# Patient Record
Sex: Female | Born: 1980 | Race: White | Hispanic: No | Marital: Married | Smoking: Former smoker
Health system: Southern US, Community
[De-identification: ages and names within clinical notes are randomized; demographics above are authoritative.]

## PROBLEM LIST (undated history)

## (undated) ENCOUNTER — Inpatient Hospital Stay (HOSPITAL_COMMUNITY): Payer: Self-pay

## (undated) DIAGNOSIS — K439 Ventral hernia without obstruction or gangrene: Secondary | ICD-10-CM

## (undated) DIAGNOSIS — D649 Anemia, unspecified: Secondary | ICD-10-CM

## (undated) DIAGNOSIS — R2240 Localized swelling, mass and lump, unspecified lower limb: Secondary | ICD-10-CM

## (undated) DIAGNOSIS — F909 Attention-deficit hyperactivity disorder, unspecified type: Secondary | ICD-10-CM

## (undated) DIAGNOSIS — E079 Disorder of thyroid, unspecified: Secondary | ICD-10-CM

## (undated) DIAGNOSIS — E039 Hypothyroidism, unspecified: Secondary | ICD-10-CM

## (undated) DIAGNOSIS — Z9889 Other specified postprocedural states: Secondary | ICD-10-CM

## (undated) DIAGNOSIS — R52 Pain, unspecified: Secondary | ICD-10-CM

## (undated) DIAGNOSIS — M778 Other enthesopathies, not elsewhere classified: Secondary | ICD-10-CM

## (undated) DIAGNOSIS — R51 Headache: Secondary | ICD-10-CM

## (undated) DIAGNOSIS — Z9884 Bariatric surgery status: Secondary | ICD-10-CM

## (undated) DIAGNOSIS — F410 Panic disorder [episodic paroxysmal anxiety] without agoraphobia: Secondary | ICD-10-CM

## (undated) DIAGNOSIS — M791 Myalgia, unspecified site: Secondary | ICD-10-CM

## (undated) DIAGNOSIS — G8929 Other chronic pain: Secondary | ICD-10-CM

## (undated) DIAGNOSIS — H029 Unspecified disorder of eyelid: Secondary | ICD-10-CM

## (undated) DIAGNOSIS — L309 Dermatitis, unspecified: Secondary | ICD-10-CM

## (undated) DIAGNOSIS — F32A Depression, unspecified: Secondary | ICD-10-CM

## (undated) DIAGNOSIS — L0390 Cellulitis, unspecified: Secondary | ICD-10-CM

## (undated) DIAGNOSIS — F411 Generalized anxiety disorder: Secondary | ICD-10-CM

## (undated) HISTORY — DX: Localized swelling, mass and lump, unspecified lower limb: R22.40

## (undated) HISTORY — DX: Pain, unspecified: R52

## (undated) HISTORY — DX: Myalgia, unspecified site: M79.10

## (undated) HISTORY — DX: Dermatitis, unspecified: L30.9

## (undated) HISTORY — PX: INCISIONAL HERNIA REPAIR: SHX193

## (undated) HISTORY — DX: Depression, unspecified: F32.A

## (undated) HISTORY — DX: Ventral hernia without obstruction or gangrene: K43.9

## (undated) HISTORY — PX: PANNICULECTOMY: SUR1001

## (undated) HISTORY — DX: Bariatric surgery status: Z98.84

## (undated) HISTORY — DX: Cellulitis, unspecified: L03.90

## (undated) HISTORY — DX: Hypothyroidism, unspecified: E03.9

## (undated) HISTORY — DX: Other chronic pain: G89.29

## (undated) HISTORY — DX: Other enthesopathies, not elsewhere classified: M77.8

## (undated) HISTORY — PX: REVISION OF ABDOMINAL SCAR: SHX2347

## (undated) HISTORY — DX: Generalized anxiety disorder: F41.1

## (undated) HISTORY — DX: Other specified postprocedural states: Z98.890

## (undated) HISTORY — PX: INCISION AND DRAINAGE: SHX5863

## (undated) HISTORY — DX: Unspecified disorder of eyelid: H02.9

## (undated) HISTORY — PX: HERNIA REPAIR: SHX51

## (undated) HISTORY — PX: BREAST REDUCTION SURGERY: SHX8

## (undated) HISTORY — PX: TUBAL LIGATION: SHX77

## (undated) HISTORY — DX: Panic disorder (episodic paroxysmal anxiety): F41.0

## (undated) HISTORY — PX: SLEEVE GASTROPLASTY: SHX1101

## (undated) HISTORY — PX: WISDOM TOOTH EXTRACTION: SHX21

## (undated) HISTORY — DX: Anemia, unspecified: D64.9

## (undated) HISTORY — DX: Attention-deficit hyperactivity disorder, unspecified type: F90.9

## (undated) HISTORY — PX: SALPINGECTOMY: SHX328

## (undated) HISTORY — PX: DILATION AND CURETTAGE OF UTERUS: SHX78

## (undated) HISTORY — DX: Disorder of thyroid, unspecified: E07.9

## (undated) HISTORY — DX: Headache: R51

---

## 2005-08-10 ENCOUNTER — Emergency Department (HOSPITAL_COMMUNITY): Admission: EM | Admit: 2005-08-10 | Discharge: 2005-08-10 | Payer: Self-pay | Admitting: Family Medicine

## 2007-11-07 ENCOUNTER — Emergency Department (HOSPITAL_COMMUNITY): Admission: EM | Admit: 2007-11-07 | Discharge: 2007-11-07 | Payer: Self-pay | Admitting: Emergency Medicine

## 2007-12-13 ENCOUNTER — Inpatient Hospital Stay (HOSPITAL_COMMUNITY): Admission: AD | Admit: 2007-12-13 | Discharge: 2007-12-13 | Payer: Self-pay | Admitting: Family Medicine

## 2007-12-23 ENCOUNTER — Inpatient Hospital Stay (HOSPITAL_COMMUNITY): Admission: RE | Admit: 2007-12-23 | Discharge: 2007-12-23 | Payer: Self-pay | Admitting: Family Medicine

## 2008-02-11 DIAGNOSIS — D649 Anemia, unspecified: Secondary | ICD-10-CM

## 2008-02-11 HISTORY — DX: Anemia, unspecified: D64.9

## 2008-03-03 ENCOUNTER — Inpatient Hospital Stay (HOSPITAL_COMMUNITY): Admission: AD | Admit: 2008-03-03 | Discharge: 2008-03-03 | Payer: Self-pay | Admitting: Obstetrics & Gynecology

## 2008-07-29 ENCOUNTER — Inpatient Hospital Stay (HOSPITAL_COMMUNITY): Admission: AD | Admit: 2008-07-29 | Discharge: 2008-07-29 | Payer: Self-pay | Admitting: Obstetrics and Gynecology

## 2008-08-18 ENCOUNTER — Inpatient Hospital Stay (HOSPITAL_COMMUNITY): Admission: AD | Admit: 2008-08-18 | Discharge: 2008-08-22 | Payer: Self-pay | Admitting: Obstetrics and Gynecology

## 2008-11-05 IMAGING — US US OB COMP LESS 14 WK
1 series · 14 of 21 positions shown · non-contrast
Comparison: None

CLINICAL DATA: First trimester pregnancy.  Pelvic cramping.

OBSTETRIC <14 WK US AND TRANSVAGINAL OB US
TECHNIQUE: Both transabdominal and transvaginal ultrasound
examinations were performed for complete evaluation of the
gestation as well as the maternal uterus, adnexal regions, and
pelvic cul-de-sac.

[Series 1: us ob comp less 14 wk · 0.33mm/px · 21 acquisitions, 14 frames shown]
[im 1/21]
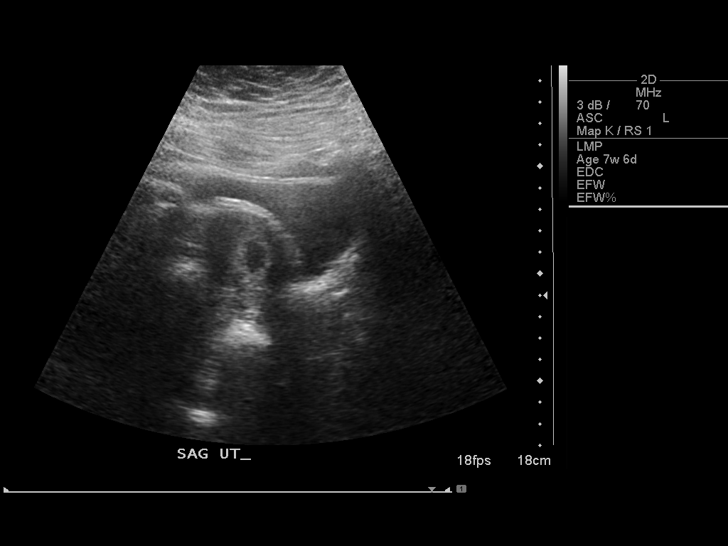
[im 3/21]
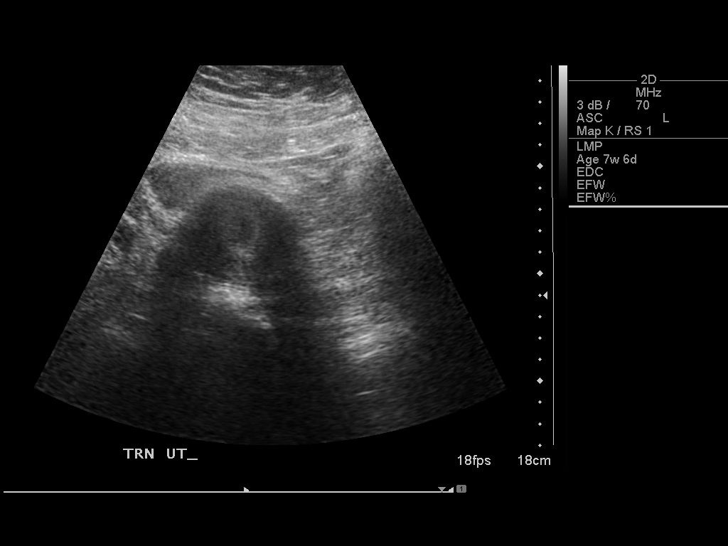
[im 4/21]
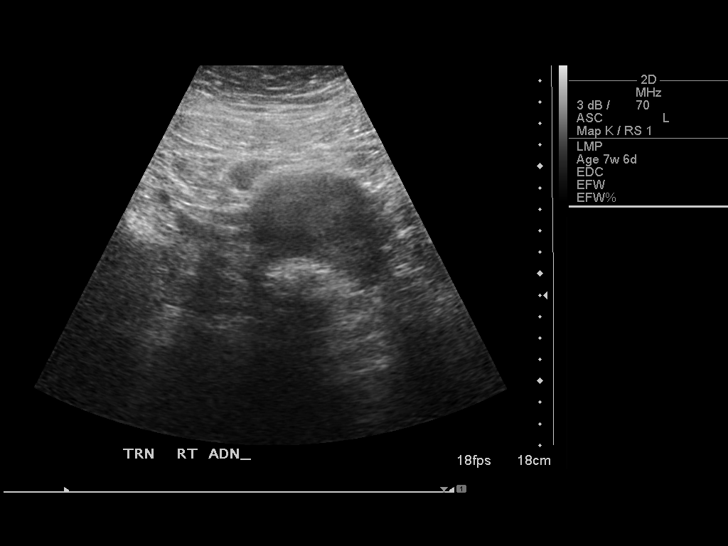
[im 6/21]
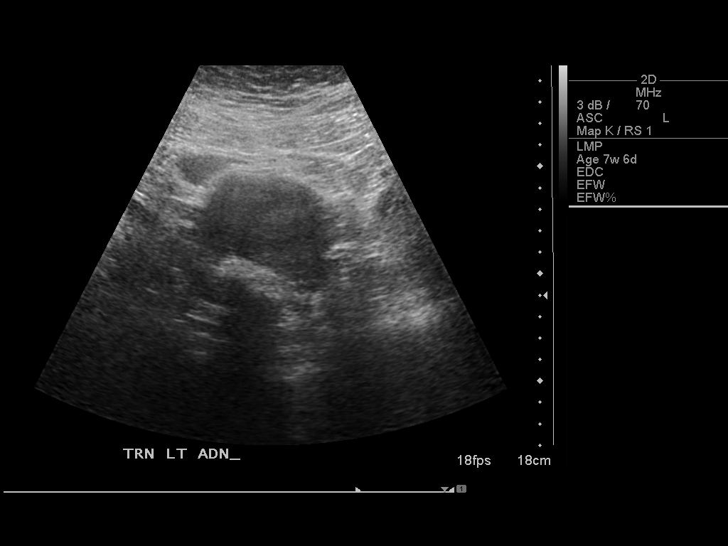
[im 7/21]
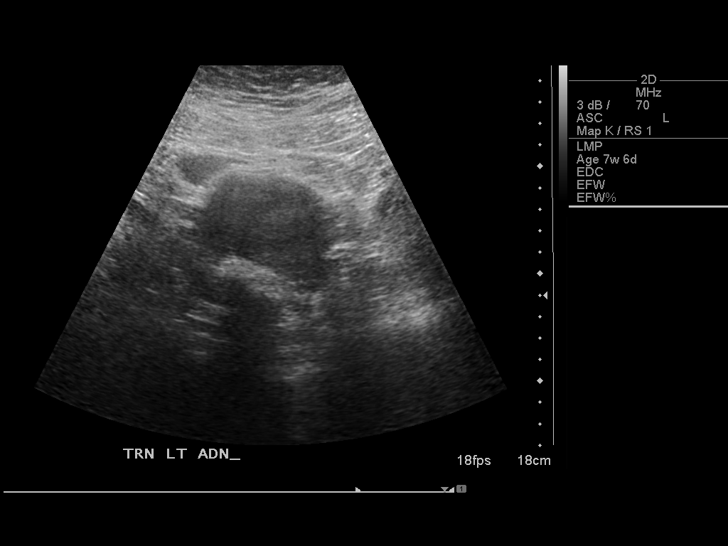
[im 9/21]
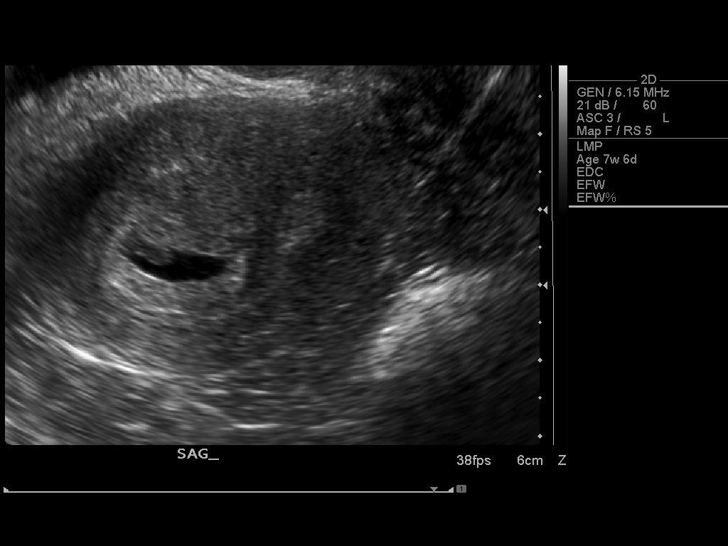
[im 10/21]
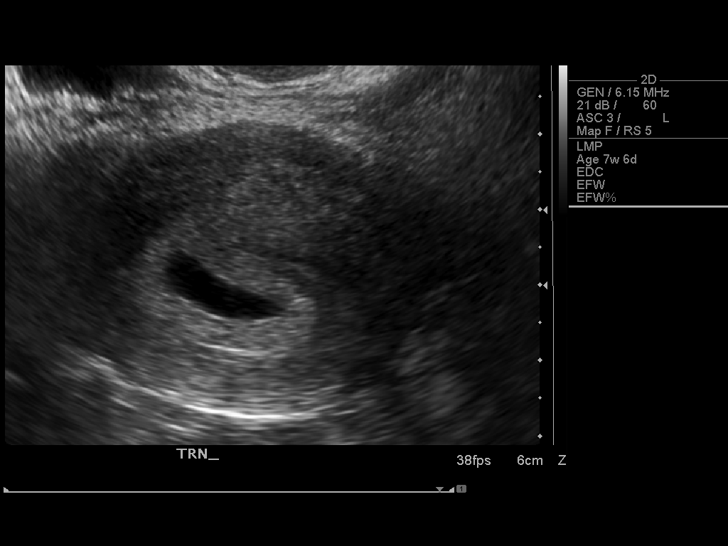
[im 12/21]
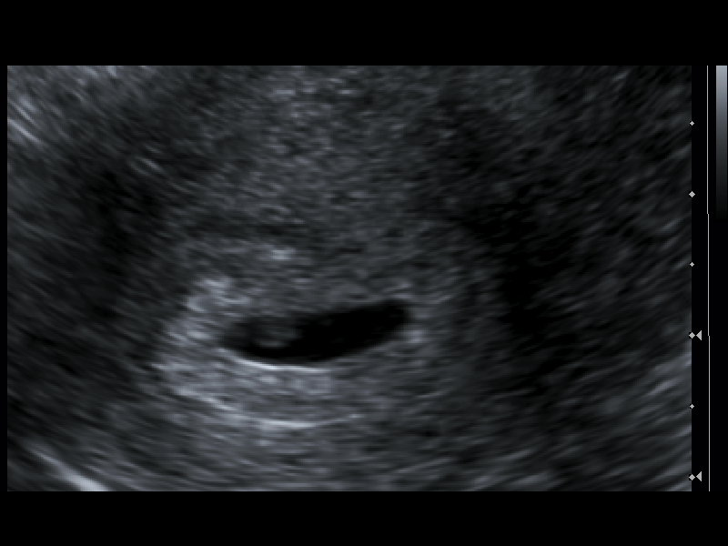
[im 13/21]
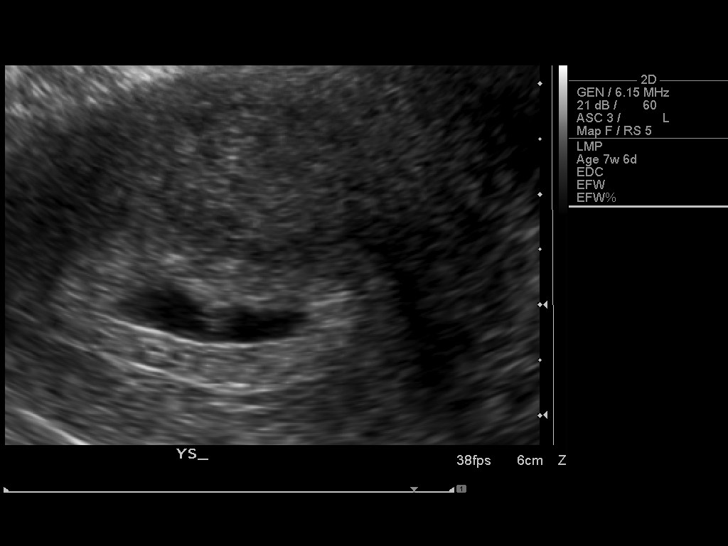
[im 15/21]
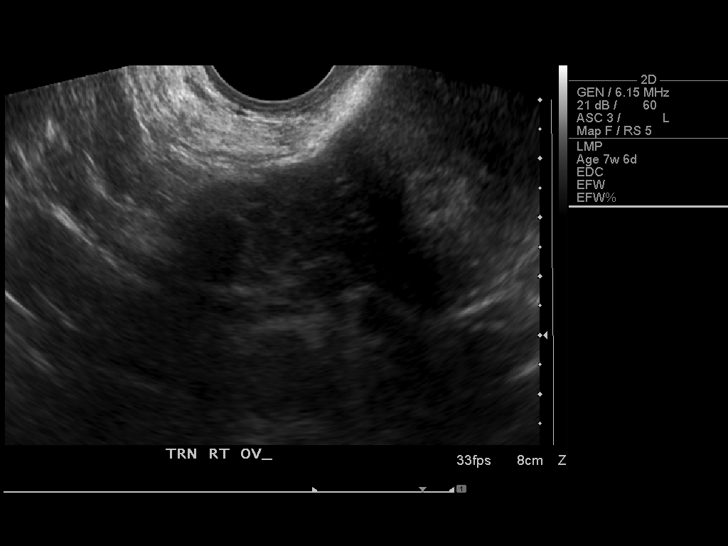
[im 16/21]
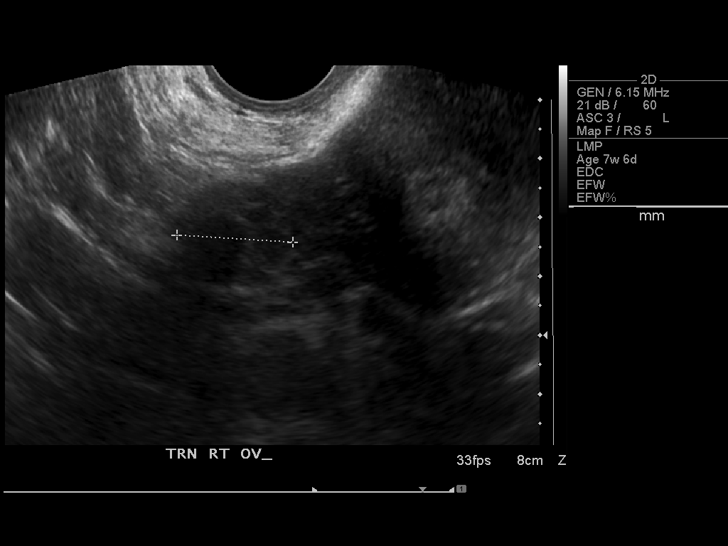
[im 18/21]
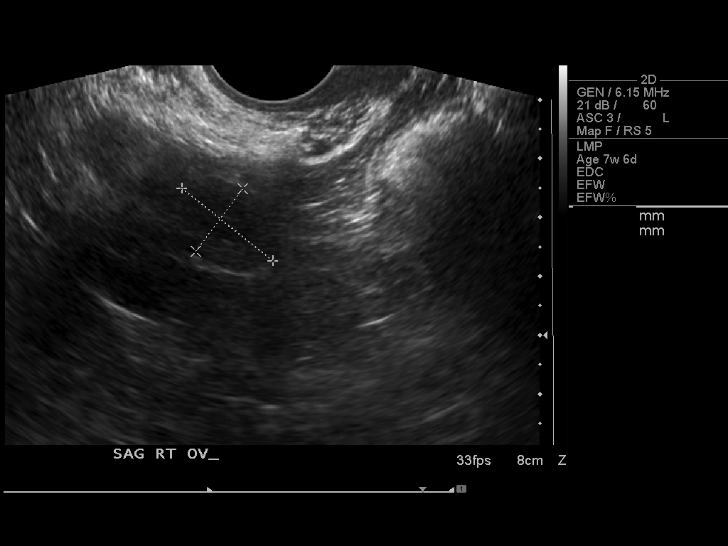
[im 19/21]
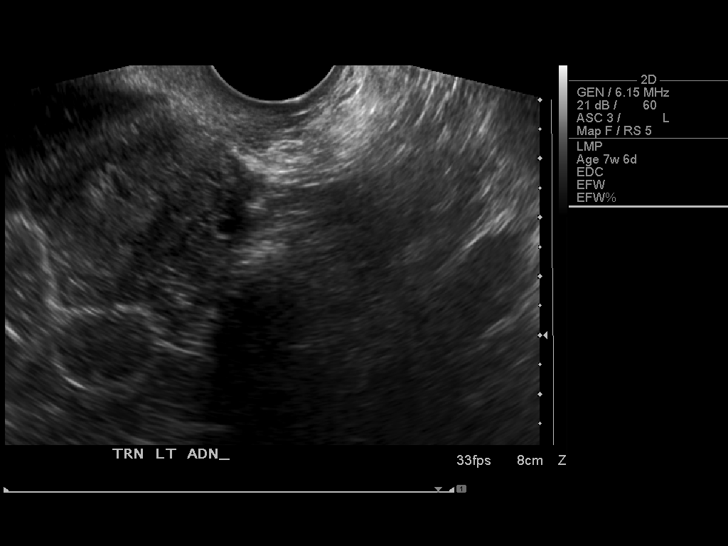
[im 21/21]
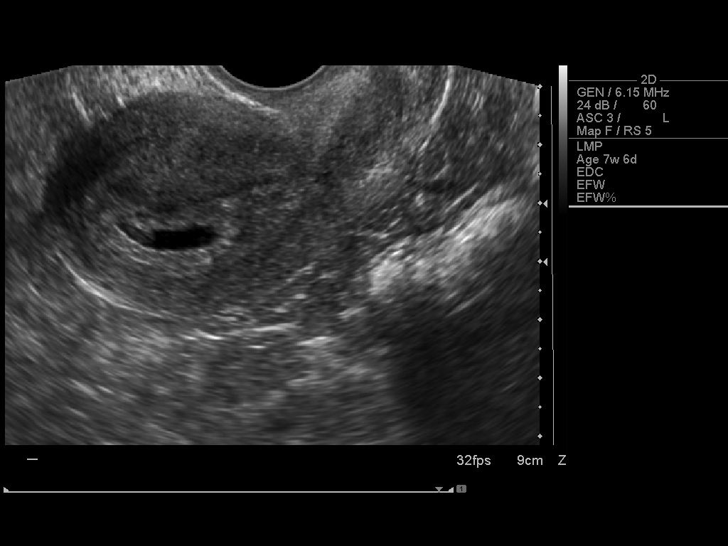

[14 of 21 positions shown; findings below may reference images not displayed]

FINDINGS: There is an intrauterine gestational sac containing a
small fetal pole with a crown rump length of 2.5 mm.  This
corresponds with a gestational age of 5 weeks 6 days.  A yolk sac
is visualized.  Fetal cardiac motion is not definitely visualized.
This may be due to early gestation.  There is no subchorionic
hematoma.

The maternal right ovary is visualized and appears normal.  The
left ovary is not seen.  There is no free pelvic fluid.
IMPRESSION: 1.  Single intrauterine gestation with best estimated gestational
age of 5 weeks 6 days. Ultrasound EDC is 08/08/2008.
2.  Fetal cardiac motion is not definitely visualized.  This may be
due to early gestation.  Correlation with quantitative beta HCG
levels and follow-up ultrasound in 3-5 days are recommended to
evaluate fetal viability.

## 2009-12-11 DIAGNOSIS — E079 Disorder of thyroid, unspecified: Secondary | ICD-10-CM

## 2009-12-11 HISTORY — DX: Disorder of thyroid, unspecified: E07.9

## 2010-05-19 LAB — CBC
HCT: 28.5 % — ABNORMAL LOW (ref 36.0–46.0)
Hemoglobin: 7.5 g/dL — CL (ref 12.0–15.0)
Hemoglobin: 9.8 g/dL — ABNORMAL LOW (ref 12.0–15.0)
MCHC: 34.3 g/dL (ref 30.0–36.0)
MCV: 86.1 fL (ref 78.0–100.0)
Platelets: 256 10*3/uL (ref 150–400)
Platelets: 256 10*3/uL (ref 150–400)
Platelets: 273 10*3/uL (ref 150–400)
RBC: 2.5 MIL/uL — ABNORMAL LOW (ref 3.87–5.11)
RDW: 17 % — ABNORMAL HIGH (ref 11.5–15.5)
RDW: 17.3 % — ABNORMAL HIGH (ref 11.5–15.5)
WBC: 15.4 10*3/uL — ABNORMAL HIGH (ref 4.0–10.5)
WBC: 15.9 10*3/uL — ABNORMAL HIGH (ref 4.0–10.5)

## 2010-05-20 LAB — CREATININE CLEARANCE, URINE, 24 HOUR
Collection Interval-CRCL: 24 hours
Creatinine, 24H Ur: 2301 mg/d — ABNORMAL HIGH (ref 700–1800)
Creatinine, Urine: 76.7 mg/dL

## 2010-05-20 LAB — CREATININE, SERUM: GFR calc Af Amer: >60 mL/min (ref >60)

## 2010-05-20 LAB — PROTEIN, URINE, 24 HOUR
Collection Interval-UPROT: 24 hours
Protein, 24H Urine: 210 mg/d — ABNORMAL HIGH (ref 50–100)
Protein, Urine: 7 mg/dL

## 2010-05-27 LAB — URINALYSIS, ROUTINE W REFLEX MICROSCOPIC
Bilirubin Urine: NEGATIVE
Glucose, UA: NEGATIVE mg/dL
Hgb urine dipstick: NEGATIVE
Ketones, ur: NEGATIVE mg/dL
Nitrite: NEGATIVE
Protein, ur: NEGATIVE mg/dL
Specific Gravity, Urine: 1.025 (ref 1.005–1.030)
Urobilinogen, UA: 0.2 mg/dL (ref 0.0–1.0)
pH: 6 (ref 5.0–8.0)

## 2010-06-25 NOTE — H&P (Signed)
NAME:  NYELI, HOLTMEYER NO.:  [id number]   MEDICAL RECORD NO.:  [id number]          PATIENT TYPE:  INP   LOCATION:  9172                          FACILITY:  [facility]   PHYSICIAN:  Janine Limbo, M.D.DATE OF BIRTH:  Oct 08, 1980   DATE OF ADMISSION:  08/18/2008  DATE OF DISCHARGE:                              HISTORY & PHYSICAL   HISTORY OF PRESENT ILLNESS:  Mr. Rod Can is a 30 year old gravida 1, para  0 at 41-1/7 weeks who presented from the office for induction secondary  to oligohydramnios and a BPP of 4/8.  Cervix was 2-3, 80% vertex at -1  by Dr. Cloretta Ned exam in the office.  She had a BPP today after  nonreactive NST. The BPP showed no fetal movement and tone but positive  fetal breathing movements and a 2 cm pocket of fluid.  The report was  that it may have had limitations due to her elevated BMI.  AFI was also  3.9 which was less than the 3rd percentile.  Therefore the patient was  admitted for induction of labor secondary to oligohydramnios and BPP of  4 out of 8.   Pregnancy has been remarkable for:  1. Transfer of care to [facility] from [facility] and      Dr. Gaynell Face.  2. PCN allergy, penicillin allergy.  3. Positive group B strep with no sensitivities done.  4. Elevated BMI of greater than 55.  5. History of irregular cycles.   LABORATORY DATA:  Prenatal labs, blood type is O+, Rh antibody negative,  VDRL nonreactive, rubella titer positive, hepatitis B surface antigen  negative, HIV was nonreactive.  Cystic fibrosis testing was negative, GC  chlamydia cultures were negative in November 2009. Glucose challenge was  elevated 180 but 3-hour GTT was normal.  The patient had a normal  quadruple screen.  Hemoglobin upon entering the practice was 11.4.  It  was 10.7 at 28 weeks.  Group B strep culture was positive at 36 weeks.  Cultures were also negative.   HISTORY OF PRESENT ILLNESS:  The patient entered care at [facility] at  33-3/7 weeks.  She had been unhappy with her prenatal  care at two other practices. She had had an elevated 1-hour Glucola but  a normal 3-hour. She also had a TSH done at her first visit which was  normal..  She had been seen at [facility] about 6 to 7 weeks for  cramping and had an ultrasound at that time, giving an [facility] of August 09, 2008.  She had had an anatomy scan of her breast at her previous office,  no records were clearly noted regarding any results of that. Upon the  patient's transfer to [facility] at 33 weeks she had report of  an ultrasound on May 16, 2008 at 27 weeks and 6 days showing estimated  fetal weight of the 92nd percentile, normal fluid and normal cervical  length.  She had stopped smoking at 6 weeks of pregnancy.  She had some  tightening at 33 weeks, was evaluated in the office  and showed negative  fetal fibronectin. She had another ultrasound at 35 weeks showing normal  fluid and estimated fetal weight 5 pounds 14 ounces.  Group B strep  positive culture was done at 35 weeks. Other cultures were negative.  She has had some low back pain as well at 38 weeks.  Her blood pressure  is 132/82 and 126/90.  PIH labs and 24-hour urine were done showing  normal values at 38 weeks. She had another ultrasound showing weight at  the 7 pounds 15 ounces, 85th percentile value. AFI was at 35th  percentile of 11.4.  She then was seen today for routine visit.  She had  a nonreactive NST.  She had a BPP of 4out of 8 with positive fetal  breathing movements, positive pocket of fluid negative tone and negative  movement.  However, there was some comment that this might have been  limited by her significant body mass index.  AFI also was shown to be  3.9 which was less than the 3rd percentile.   OBSTETRICAL HISTORY:  The patient is a primigravida.   PAST MEDICAL HISTORY:  She reports usual childhood illnesses.  She is  allergic to penicillin, which causes hives.  She  was a smoker of one  pack per day since approximately 8 years but she stopped at [redacted] weeks  gestation.   FAMILY HISTORY:  Her father is a non-insulin a diabetic.  Her sister has  kidney stones. Her brother had childhood epilepsy.  Her paternal  grandmother has Parkinson's disease.  Maternal grandmother has  Alzheimer's. Her father is a smoker.   PAST SURGICAL HISTORY:  Includes wisdom teeth with local anesthesia in  2004.   GENETIC HISTORY:  Remarkable for the father of the baby being a twin.   SOCIAL HISTORY:  The patient is married to the father of the baby.  He  is involved and supportive. His name is Teresa Coombs. The patient is  Caucasian of the [place] faith.  She has a high school education.  She  is employed at [employer]. Her husband is also high school educated.  He  is employed as a Psychologist, occupational. She has been followed by  the certified nurse midwife service at [place].  She denies  any alcohol, drug or tobacco use during this pregnancy since she stopped  smoking at [redacted] weeks gestation.   PHYSICAL EXAMINATION:  VITAL SIGNS:  Blood pressures 112/60. Other vital  signs stable.  HEENT: Within normal limits.  LUNGS:  Breath sounds are clear.  CARDIOVASCULAR:  Regular rate and rhythm without murmur.  BREASTS:  Soft and nontender.  ABDOMEN:  Fundal height is difficult to assess secondary to significant  pannus and a pendulous abdomen.  GENITOURINARY:  Uterine contractions were none initially, now very  occasional. Fetal heart rate is reactive with no decelerations.  There  is positive fetal movement noted.  Cervix is 2+, 75% vertex at minus one  station.  EXTREMITIES:  Deep tendon reflexes are 2+ without clonus.  There is 1+  edema noted in the lower extremities.   IMPRESSION:  1. Intrauterine pregnancy at 41-1/7 weeks.  2. Oligo with a BPP of 4 out of 8 but reactive NST.  3. Favorable cervix.  4. Positive group B strep with penicillin allergy  and no      sensitivities.  5. Morbid obesity.   PLAN:  1. Admit to birthing suite per consult with Dr. Stefano Gaul as attending  physician.  2. Routine certified nurse midwife orders.  3. In light of elevated BMI and reactive NST, will start Pitocin at      6:00 a.m. and initiate clindamycin then. This will allow for active      labor during hours where more resources are available in the      institution as needed.  This plan was reviewed with Dr. Stefano Gaul.      The patient is also agreeable with the plan.      Renaldo Reel Emilee Hero, C.N.M.      Janine Limbo, M.D.  Electronically Signed    VLL/MEDQ  D:  08/18/2008  T:  08/18/2008  Job:  [id number]

## 2010-06-25 NOTE — Op Note (Signed)
NAME:  Kristy Castro, Kristy Castro NO.:  [id number]   MEDICAL RECORD NO.:  [id number]          PATIENT TYPE:  INP   LOCATION:  9118                          [facility]   PHYSICIAN:  Janine Limbo, M.D.DATE OF BIRTH:  06/07/1980   DATE OF PROCEDURE:  08/20/2008  DATE OF DISCHARGE:                               OPERATIVE REPORT   PREOPERATIVE DIAGNOSES:  1. 41-week and 3-day gestation.  2. Obesity (weight 407 pounds, height 5 feet 8 inches).  3. Oligohydramnios.  4. Nonreassuring antenatal assessment.  5. Second stage dystocia with maternal fatigue.   POSTOPERATIVE DIAGNOSES:  1. 41-week and 3-day gestation.  2. Obesity (weight 407 pounds, height 5 feet 8 inches).  3. Oligohydramnios.  4. Nonreassuring antenatal assessment.  5. Second stage dystocia with maternal fatigue.  6. Second-degree midline laceration.  7. Postpartum hemorrhage.  8. Macrosomia.   PROCEDURES:  1. Vacuum extraction, vaginal delivery.  2. Repair of second-degree midline laceration.   OBSTETRICIAN:  Janine Limbo, MD   FIRST ASSISTANT:  Larna Daughters, CNM   ANESTHETIC:  Epidural and 1% lidocaine.   DISPOSITION:  Kristy Castro is a 30 year old female, gravida 1, para 0, who  presented to the [facility] [place] at 41 weeks and 1 day  gestation on August 18, 2008.  The patient had been followed at the [facility]  The patient's pregnancy was complicated by the fact that she is  obese.  The patient had an ultrasound performed on the day of admission,  which showed oligohydramnios.  She had a biophysical profile with a  score of 4/8.  The decision was made to have the patient come to the  hospital for induction of labor.  Upon admission, the patient's cervix  was 2-3 cm dilated, 75% effaced, and the presenting part was at a -1  station.  The patient was started on low-dose Pitocin, and on the  morning of August 19, 2008, her membranes were ruptured.  She was started  on clindamycin because her Beta strep culture was positive.  The patient  progressed very slowly.  An intrauterine pressure catheter was placed.  The patient labored throughout the day and the throughout the night.  She was noted to be completely dilated at 5:30 a.m. on August 20, 2008.  The patient pushed for greater than 1-1/2 hours.  She began to tire from  the pushing process.  The patient's cervix was noted to be completely  dilated, 100% effaced, and the head could be seen at the perineum.  I  discussed the management options with the patient, the father of the  baby, and the aunt of the baby.  We discussed resting, continued  pushing, operative vaginal delivery, and cesarean section.  The risk and  benefits of those options were outlined.  The patient elected to proceed  with operative vaginal delivery.  We discussed vacuum extraction,  vaginal delivery, and forceps vaginal delivery.  The risk and benefits  of those 2 options were outlined.  The patient elected to proceed  with  vacuum extraction.  The specific risk associated with vacuum extraction  were detailed including caput formation, hematoma formation, the rare  risk of intracranial bleeding, the risk that the vacuum extraction would  be unsuccessful and then we would need to proceed with cesarean section,  and the risk that the fetal head may deliver, but a shoulder dystocia  may occur, causing difficulty.  After carefully considering those  options, the patient wanted to proceed with vacuum extraction vaginal  delivery.   DESCRIPTION OF PROCEDURE:  The patient was already in the delivery  suite.  The patient's perineum and vagina were prepped.  She was then  sterilely draped.  The Kiwi I vacuum extractor was applied to the fetal  head.  Again, the presenting part could be seen at the introitus.  The  infant was in an occiput anterior presentation.  The  Foley catheter was  removed.  The patient was allowed to push and with 1 prolonged push, the  fetal head was delivered.  The mouth and nose were suctioned.  No nuchal  cord was present.  The patient pushed and we were able to deliver the  shoulders and then the infant.  The cord was clamped and cut and the  infant was handed to the awaiting pediatric team.  A cord blood pH was  obtained from the artery.  The placenta was then delivered and given to  the cord blood registry team.  At this point, we noticed that the IV was  not functioning properly.  The patient was then was noted to have  bleeding.  She was given Pitocin intramuscularly and Cytotec 100 mcg  rectally.  The uterus was massaged, although this was very difficult  because of the patient's obesity of abdomen.  Hemostasis was achieved.  The introitus, and vagina were inspected and the patient was noted to  have a second-degree midline laceration.  A 10 mL of 1% lidocaine were  injected into the laceration.  The laceration was repaired using a  running suture of 3-0 Vicryl.  Hemostasis was noted to be adequate.  The  patient was returned to the supine position and was allowed to rest  after her perineum was cleaned.  The infant was taken to the full-term  nursery to be observed carefully.   FINDINGS:  A 9 pounds 10 ounces female infant Kristy Castro) was delivered from an  occiput anterior presentation.  The Apgars scores were 6 at 1 minute and  7 at 5 minutes.  Arterial cord blood pH was 7.25.  There was a three-  vessel cord present.  The placenta appeared normal.  There was a second-  degree midline laceration present.  There were no upper vaginal or  cervical lacerations present.      Janine Limbo, M.D.  Electronically Signed     AVS/MEDQ  D:  08/20/2008  T:  08/20/2008  Job:  [id number]

## 2010-06-25 NOTE — Discharge Summary (Signed)
NAME:  Kristy Castro, Kristy Castro NO.:  [id number]   MEDICAL RECORD NO.:  [id number]          PATIENT TYPE:  INP   LOCATION:  9118                          [facility]   PHYSICIAN:  Osborn Coho, M.D.   DATE OF BIRTH:  1980-11-07   DATE OF ADMISSION:  08/18/2008  DATE OF DISCHARGE:  08/22/2008                               DISCHARGE SUMMARY   ADMITTING DIAGNOSES:  1. Intrauterine pregnancy at 41-1/7th weeks.  2. Oligohydramnios with biophysical profile of 4/8.  3. Favorable cervix.  4. Positive group B streptococcus.  5. Morbid obesity.   DISCHARGE DIAGNOSES:  1. A 41-3/7th weeks.  2. Oligohydramnios.  3. Nonreassuring fetal heart rate.  4. Morbid obesity.  5. Second stage dystocia and fatigue.  6. Postpartum hemorrhage.  7. Postpartum anemia.  8. Large for gestational age infant.   PROCEDURES:  1. Vacuum-assisted vaginal delivery.  2. Repair of second-degree midline laceration.  3. Induction of labor.  4. Repair of second-degree midline laceration.   HOSPITAL COURSE:  Ms. Kristy Castro is a 30 year old, gravida 1, para 0, at 77-  1/7th weeks, who was admitted from the office on the evening of August 18, 2008, for induction secondary to oligohydramnios on ultrasound that day  and a BPP of 4/8.  Cervix at that time was 2-3, 80% vertex at -1 exam.  Amniotic fluid index done on the day of admission was 3.9 which was less  than the third percentile, and she had the BPP count off for no movement  and no fetal tone.  She did have fetal breathing movements and a pocket  of fluid on that BPP.  Fetal heart rate, however, was reactive on  evaluation, but the decision was made to proceed with induction.   Her pregnancy has been remarkable for:  1. Transfer of care to [facility] from [facility] and Dr.      Gaynell Face.  2. PENICILLIN allergy.  3. Positive group B strep and no sensitivities done.  4. Elevated BMI with morbid obesity.  5. Irregular cycles.   On admission,  cervix was loose, 2 cm, 75% vertex, -2.  She was having  some occasional contractions.  Fetal heart rate was reactive.  The  decision was made to start Pitocin on the morning of August 19, 2008.  At  that time, cervix was 3, 90% vertex, -1 to -2 station.  Clindamycin was  begun for group B strep prophylaxis.  Artificial rupture of membranes  was accomplished with clear fluid.  An intrauterine pressure catheter  and fetal scalp electrode were applied.  Vital signs were stable  throughout.  An epidural was placed late in the evening of August 19, 2008.  At that time, cervix was 5-6, 90% vertex at -1 to 0 station.  Fetal heart rate remained reactive.  She continued to have some  dysfunction in her labor pattern.  Pitocin was continued.  At 1:15 in  the morning, she was 8-9 cm completely effaced with the vertex at 0  station.  By 5:30 in the morning of August 20, 2008, she was complete with  a vertex at +1 to +2 station.  She was having no awareness of pressure.  By 6:45, she did have an urge to push, vertex was +2 station with caput.  The patient then began to push vigorously.  Over the next hour and a  half, she pushed well and then she began to demonstrate maternal  exhaustion.  She was consented for vacuum-assisted vaginal delivery  trial.  This was done by Dr. Stefano Gaul with no complications.  Findings  were a viable female by the name of Evra, weight 9 pounds 10 ounces,  Apgars were 6 and 7, cord pH was 7.25.  There was a second-degree  midline laceration that was repaired in the usual fashion.  The patient  did have a postpartum hemorrhage that was treated with Cytotec in the  early postpartum time.  The patient tolerated the procedure well and was  taken to recovery phase in good condition.  Infant was taken to the full-  term nursery.  By postpartum day 1, the patient was doing well.  She was  up ad lib.  She was tired, but she had no syncope or dizziness.  Orthostatics were stable.   Hemoglobin on day one was 7.5, it was down  from 10.7 predelivery and 9.8 immediately postdelivery, white blood cell  count was 15.9, and platelet count was 256.  Her perineum was healing.  Her lochia was scant.  Her fundus was firm.  She did have 1+ edema noted  in the lower extremities.  The patient was begun on iron.  She was given  a sitz bath.  We did review risks and benefits of transfusion.  The  patient declined at that time.  By postpartum day 2, the patient was  continuing to do well.  She was breast-feeding and supplementing due to  infant weight loss.  She was having no issues with syncope or dizziness.  Her blood pressures were in the 130s-140s over 70s-80s.  Pulse was 90.  She was afebrile.  Her physical exam was within normal limits.  Her  fundus was firm.  Her lochia was scant.  Her perineum was healing.  Her  deep tendon reflexes were 1 to 2+ without clonus.  There was still 1+  edema noted in the lower extremities.  Dr. Su Hilt was consulted, and  the decision was made to discharge the patient home with a plan made for  hydrochlorothiazide and a blood pressure followup in 1 week in light of  her morbid obesity.  Discharge instructions per [facility]  handout.  PIH precautions were also reviewed with the patient.   DISCHARGE MEDICATIONS:  1. Hydrochlorothiazide 25 mg 1 p.o. daily x1 week.  2. Micronor 1 p.o. daily.  3. Motrin 600 mg p.o. q.6 h. p.r.n. pain.  4. Percocet 5/325 one to two p.o. q.3-4 h. p.r.n. pain.   Discharge follow up will occur in 1 week at [facility] for  blood pressure recheck and then follow up as needed and as scheduled.      Renaldo Reel Emilee Hero, C.N.M.      Osborn Coho, M.D.  Electronically Signed    VLL/MEDQ  D:  08/22/2008  T:  08/22/2008  Job:  [id number]

## 2010-11-11 LAB — POCT I-STAT, CHEM 8
BUN: 10
Glucose, Bld: 122 — ABNORMAL HIGH
HCT: 40
Sodium: 139

## 2010-11-11 LAB — D-DIMER, QUANTITATIVE: D-Dimer, Quant: <0.22

## 2010-11-13 LAB — URINE MICROSCOPIC-ADD ON

## 2010-11-13 LAB — CBC
HCT: 38.2
MCV: 89.7
RBC: 4.26
RDW: 13.9

## 2010-11-13 LAB — WET PREP, GENITAL
Clue Cells Wet Prep HPF POC: NONE SEEN
Trich, Wet Prep: NONE SEEN

## 2010-11-13 LAB — POCT PREGNANCY, URINE: Preg Test, Ur: POSITIVE

## 2010-11-13 LAB — URINALYSIS, ROUTINE W REFLEX MICROSCOPIC
Ketones, ur: 15 — AB
Nitrite: NEGATIVE

## 2010-11-13 LAB — GC/CHLAMYDIA PROBE AMP, GENITAL: Chlamydia, DNA Probe: NEGATIVE

## 2011-05-21 ENCOUNTER — Ambulatory Visit (INDEPENDENT_AMBULATORY_CARE_PROVIDER_SITE_OTHER): Payer: Medicaid Other | Admitting: Obstetrics and Gynecology

## 2011-05-21 DIAGNOSIS — Z331 Pregnant state, incidental: Secondary | ICD-10-CM

## 2011-05-21 LAB — POCT URINALYSIS DIPSTICK: pH, UA: 6

## 2011-05-22 LAB — OBSTETRIC PANEL
Antibody Screen: NEGATIVE
Basophils Relative: 0 % (ref 0–1)
Eosinophils Absolute: 0.2 10*3/uL (ref 0.0–0.7)
Lymphocytes Relative: 23 % (ref 12–46)
Lymphs Abs: 3 10*3/uL (ref 0.7–4.0)
MCV: 83.4 fL (ref 78.0–100.0)
Neutro Abs: 9.3 10*3/uL — ABNORMAL HIGH (ref 1.7–7.7)
Neutrophils Relative %: 71 % (ref 43–77)
Platelets: 335 10*3/uL (ref 150–400)
RBC: 4.4 MIL/uL (ref 3.87–5.11)
RDW: 16.2 % — ABNORMAL HIGH (ref 11.5–15.5)
Rh Type: POSITIVE
WBC: 13 10*3/uL — ABNORMAL HIGH (ref 4.0–10.5)

## 2011-05-22 LAB — HIV ANTIBODY (ROUTINE TESTING W REFLEX): HIV: NONREACTIVE

## 2011-05-24 LAB — CULTURE, OB URINE

## 2011-05-26 ENCOUNTER — Telehealth: Payer: Self-pay | Admitting: Obstetrics and Gynecology

## 2011-05-26 NOTE — Telephone Encounter (Signed)
Returned pt's call. LM to return call.   

## 2011-05-26 NOTE — Telephone Encounter (Signed)
Spoke with pt. States since 7am has had sharp abd pain, more on lt side.  Took Gas-X.   Had BM.  Has had 12oz water.No bleeding.   No recent IC.  No UTI sx. Pain is constant.  Some relief with rest, but inc with movement.  Will consult w/provider.

## 2011-05-26 NOTE — Telephone Encounter (Signed)
cht routed to nancy

## 2011-05-26 NOTE — Telephone Encounter (Signed)
TC to pt.   Per SL, advised increased water, warm bath/shower or heat,  Take Tylenol.  May be muscular-skeletal.   To call if no improvement. Pt verbalizes comprehension.

## 2011-05-26 NOTE — Telephone Encounter (Signed)
TC from pt.   States she has started burping and pain has been greatly reduced. Os nor #1 on pain scale.   Is able to move and even "touch her toes" without discomfort.   Declines eval at this time.   Will call is pain recurs.  VL notifed.

## 2011-05-26 NOTE — Telephone Encounter (Signed)
TC from pt.   States has tried all suggestions.   Is having pressure when lying down but increases with movement.  Consult with VL, CNM.  Advised  Pt to be seen at [facility].  Pt verbalizes comprehension.

## 2011-06-05 ENCOUNTER — Encounter: Payer: Medicaid Other | Admitting: Obstetrics and Gynecology

## 2011-06-11 ENCOUNTER — Encounter: Payer: Medicaid Other | Admitting: Obstetrics and Gynecology

## 2011-06-30 ENCOUNTER — Encounter: Payer: Self-pay | Admitting: Obstetrics and Gynecology

## 2011-08-11 ENCOUNTER — Encounter: Payer: Medicaid Other | Admitting: Obstetrics and Gynecology

## 2017-05-08 ENCOUNTER — Inpatient Hospital Stay (HOSPITAL_COMMUNITY)
Admission: AD | Admit: 2017-05-08 | Discharge: 2017-05-08 | Disposition: A | Discharge status: Home / Self Care | Payer: BLUE CROSS/BLUE SHIELD | Source: Ambulatory Visit | Attending: Obstetrics and Gynecology | Admitting: Obstetrics and Gynecology

## 2017-05-08 ENCOUNTER — Inpatient Hospital Stay (HOSPITAL_COMMUNITY): Payer: BLUE CROSS/BLUE SHIELD

## 2017-05-08 ENCOUNTER — Encounter (HOSPITAL_COMMUNITY): Payer: Self-pay | Admitting: *Deleted

## 2017-05-08 DIAGNOSIS — O468X1 Other antepartum hemorrhage, first trimester: Secondary | ICD-10-CM

## 2017-05-08 DIAGNOSIS — Z87891 Personal history of nicotine dependence: Secondary | ICD-10-CM | POA: Diagnosis not present

## 2017-05-08 DIAGNOSIS — O418X10 Other specified disorders of amniotic fluid and membranes, first trimester, not applicable or unspecified: Secondary | ICD-10-CM

## 2017-05-08 DIAGNOSIS — R1011 Right upper quadrant pain: Secondary | ICD-10-CM | POA: Insufficient documentation

## 2017-05-08 DIAGNOSIS — Z3A09 9 weeks gestation of pregnancy: Secondary | ICD-10-CM | POA: Diagnosis not present

## 2017-05-08 DIAGNOSIS — Z880 Allergy status to penicillin: Secondary | ICD-10-CM | POA: Insufficient documentation

## 2017-05-08 DIAGNOSIS — R109 Unspecified abdominal pain: Secondary | ICD-10-CM | POA: Diagnosis present

## 2017-05-08 DIAGNOSIS — Z3491 Encounter for supervision of normal pregnancy, unspecified, first trimester: Secondary | ICD-10-CM

## 2017-05-08 DIAGNOSIS — O3680X0 Pregnancy with inconclusive fetal viability, not applicable or unspecified: Secondary | ICD-10-CM

## 2017-05-08 DIAGNOSIS — O26891 Other specified pregnancy related conditions, first trimester: Secondary | ICD-10-CM | POA: Insufficient documentation

## 2017-05-08 LAB — URINALYSIS, ROUTINE W REFLEX MICROSCOPIC
Bilirubin Urine: NEGATIVE
Glucose, UA: NEGATIVE mg/dL
Hgb urine dipstick: NEGATIVE
KETONES UR: NEGATIVE mg/dL
Nitrite: NEGATIVE
PH: 7 (ref 5.0–8.0)
PROTEIN: NEGATIVE mg/dL
Specific Gravity, Urine: 1.013 (ref 1.005–1.030)

## 2017-05-08 LAB — CBC
HEMATOCRIT: 35.3 % — AB (ref 36.0–46.0)
HEMOGLOBIN: 11.5 g/dL — AB (ref 12.0–15.0)
MCH: 28.3 pg (ref 26.0–34.0)
MCHC: 32.6 g/dL (ref 30.0–36.0)
MCV: 86.9 fL (ref 78.0–100.0)
Platelets: 358 10*3/uL (ref 150–400)
RBC: 4.06 MIL/uL (ref 3.87–5.11)
RDW: 15 % (ref 11.5–15.5)
WBC: 12.8 10*3/uL — ABNORMAL HIGH (ref 4.0–10.5)

## 2017-05-08 LAB — COMPREHENSIVE METABOLIC PANEL
ALBUMIN: 3.4 g/dL — AB (ref 3.5–5.0)
ALT: 19 U/L (ref 14–54)
ANION GAP: 10 (ref 5–15)
AST: 16 U/L (ref 15–41)
Alkaline Phosphatase: 125 U/L (ref 38–126)
BUN: 12 mg/dL (ref 6–20)
CO2: 24 mmol/L (ref 22–32)
Calcium: 9 mg/dL (ref 8.9–10.3)
Chloride: 102 mmol/L (ref 101–111)
Creatinine, Ser: 0.59 mg/dL (ref 0.44–1.00)
GFR calc Af Amer: >60 mL/min (ref >60)
GFR calc non Af Amer: >60 mL/min (ref >60)
GLUCOSE: 126 mg/dL — AB (ref 65–99)
POTASSIUM: 4.2 mmol/L (ref 3.5–5.1)
Sodium: 136 mmol/L (ref 135–145)
Total Bilirubin: 0.2 mg/dL — ABNORMAL LOW (ref 0.3–1.2)
Total Protein: 7.6 g/dL (ref 6.5–8.1)

## 2017-05-08 LAB — POCT PREGNANCY, URINE: Preg Test, Ur: POSITIVE — AB

## 2017-05-08 NOTE — Discharge Instructions (Signed)

## 2017-05-08 NOTE — [facility] Provider Note (Signed)
History     CSN: [id number]  Arrival date and time: 05/08/17 1958   None     Chief Complaint  Patient presents with  . Abdominal Pain   HPI 37 yo G3P2002 at 3354w2d by LMP here for the evaluation of abdominal pain. Patient has a history of umbilical hernia repair 4 years ago with mesh which needed to be revised with mesh removal. She states that she noted a bulge yesterday evening in the area of her previous hernia repair. The bulge was not tender but associated with soreness. She also reports some RUQ pain rating it 5/10. She reports some nausea but denies emesis. She admits to working out and [employer] Energylifting weights which may have contributed to the return of her hernia. She is scheduled to initiate prenatal care on 4/12 with [facility]  OB History    Gravida  3   Para  1   Term  1   Preterm      AB      Living  1     SAB      TAB      Ectopic      Multiple      Live Births  1           Past Medical History:  Diagnosis Date  . Anemia 2010   AFTER DELIVERY OF FIRST CHILD  . Headache(784.0)    OCCASIONALLY WITH PREGNANCY  . Hypothyroidism    ON SYNTHROID 50 MCG  . Thyroid disease 12/2009   HYPOTHYROID    Past Surgical History:  Procedure Laterality Date  . WISDOM TOOTH EXTRACTION  11/2010, 2004   2 REMOVED LT UPPER AND LOWER    Family History  Problem Relation Age of Onset  . Diabetes type II Paternal Grandfather   . Dementia Maternal Grandmother   . Heart disease Father   . Asthma Father   . Emphysema Father   . Diabetes type II Father        USES INSULIN  . Mental illness Father        DEPRESSION  . Seizures Brother        AS A CHILD  . Asthma Sister   . Mental illness Sister        DEPRESSION  . Asthma Son        WITH A COLD ONLY  . Diabetes type II Paternal Aunt     Social History   Tobacco Use  . Smoking status: Former Smoker    Packs/day: 0.25    Years: 10.00    Pack years: 2.50    Types: Cigarettes  . Smokeless tobacco: Former NeurosurgeonUser    Quit date: 04/18/2011  Substance Use Topics  . Alcohol use: No  . Drug use: No    Allergies:  Allergies  Allergen Reactions  . Penicillins     UNSURE OF REACTION, WAS CHILDHOOD ALLERGY    Medications Prior to Admission  Medication Sig Dispense Refill Last Dose  . levothyroxine (SYNTHROID) 50 MCG tablet Take 50 mcg by mouth daily.   Taking  . Prenatal Vit-Fe Fumarate-FA (MULTIVITAMIN-PRENATAL) 27-0.8 MG TABS Take 1 tablet by mouth daily.   Taking    Review of Systems  See pertinent in HPI Physical Exam   Blood pressure 136/75, pulse 93, temperature 98.2 F (36.8 C), resp. rate 18, height 5\' 9"  (1.753 m), weight (!) 390 lb (176.9 kg), last menstrual period 03/04/2017, SpO2 100 %, unknown if currently breastfeeding.  Physical Exam GENERAL: Well-developed, well-nourished  female in no acute distress.  ABDOMEN: Soft, nontender, nondistended. Obese. Hernia not appreciated on exam EXTREMITIES: No cyanosis, clubbing, or edema, 2+ distal pulses.  [facility]U Course  Procedures  MDM Bedside ultrasound- limited due to body habitus. Visible gestational sac, no yolk sac or fetal pole  CBC, CMP ordered RUQ ultrasound ordered to rule out gallstones Dating ultrasound ordered given limited bedside ultrasound findings  Care turned over to Antony Odea, CNM  Assessment and Plan    Danashia Landers 05/08/2017, 8:36 PM

## 2017-05-08 NOTE — MAU Note (Signed)
Was laying down thurs night and noticed bump around navel that felt weird but not painful. Today having some pain in RUQ that is dull. Have had umbilical hernia repair 5814yrs ago. Hard to take a deep breath

## 2017-05-08 NOTE — MAU Provider Note (Signed)
History   CSN: [id number]  Arrival date and time: 05/08/17 1958   None        Chief Complaint  Patient presents with  . Abdominal Pain   HPI 37 yo G3P2002 at [redacted]w[redacted]d by LMP here for the evaluation of abdominal pain. Patient has a history of umbilical hernia repair 4 years ago with mesh which needed to be revised with mesh removal. She states that she noted a bulge yesterday evening in the area of her previous hernia repair. The bulge was not tender but associated with soreness. She also reports some RUQ pain rating it 5/10. She reports some nausea but denies emesis. She admits to working out and [employer] which may have contributed to the return of her hernia. She is scheduled to initiate prenatal care on 4/12 with [facility]          OB History    Gravida  3   Para  1   Term  1   Preterm      AB      Living  1     SAB      TAB      Ectopic      Multiple      Live Births  1           Past Medical History:  Diagnosis Date  . Anemia 2010   AFTER DELIVERY OF FIRST CHILD  . Headache(784.0)    OCCASIONALLY WITH PREGNANCY  . Hypothyroidism    ON SYNTHROID 50 MCG  . Thyroid disease 12/2009   HYPOTHYROID         Past Surgical History:  Procedure Laterality Date  . WISDOM TOOTH EXTRACTION  11/2010, 2004   2 REMOVED LT UPPER AND LOWER         Family History  Problem Relation Age of Onset  . Diabetes type II Paternal Grandfather   . Dementia Maternal Grandmother   . Heart disease Father   . Asthma Father   . Emphysema Father   . Diabetes type II Father        USES INSULIN  . Mental illness Father        DEPRESSION  . Seizures Brother        AS A CHILD  . Asthma Sister   . Mental illness Sister        DEPRESSION  . Asthma Son        WITH A COLD ONLY  . Diabetes type II Paternal Aunt     Social History        Tobacco Use  . Smoking status: Former Smoker    Packs/day: 0.25    Years: 10.00     Pack years: 2.50    Types: Cigarettes  . Smokeless tobacco: Former Neurosurgeon    Quit date: 04/18/2011  Substance Use Topics  . Alcohol use: No  . Drug use: No    Allergies:       Allergies  Allergen Reactions  . Penicillins     UNSURE OF REACTION, WAS CHILDHOOD ALLERGY           Medications Prior to Admission  Medication Sig Dispense Refill Last Dose  . levothyroxine (SYNTHROID) 50 MCG tablet Take 50 mcg by mouth daily.   Taking  . Prenatal Vit-Fe Fumarate-FA (MULTIVITAMIN-PRENATAL) 27-0.8 MG TABS Take 1 tablet by mouth daily.   Taking    Review of Systems  See pertinent in HPI Physical Exam   Blood pressure 136/75, pulse  93, temperature 98.2 F (36.8 C), resp. rate 18, height 5\' 9"  (1.753 m), weight (!) 390 lb (176.9 kg), last menstrual period 03/04/2017, SpO2 100 %, unknown if currently breastfeeding.  Physical Exam GENERAL: Well-developed, well-nourished female in no acute distress.  ABDOMEN: Soft, nontender, nondistended. Obese. Hernia not appreciated on exam EXTREMITIES: No cyanosis, clubbing, or edema, 2+ distal pulses.  MAU Course  Procedures  Results for orders placed or performed during the hospital encounter of 05/08/17 (from the past 24 hour(s))  Urinalysis, Routine w reflex microscopic     Status: Abnormal   Collection Time: 05/08/17  8:15 PM  Result Value Ref Range   Color, Urine STRAW (A) YELLOW   APPearance CLEAR CLEAR   Specific Gravity, Urine 1.013 1.005 - 1.030   pH 7.0 5.0 - 8.0   Glucose, UA NEGATIVE NEGATIVE mg/dL   Hgb urine dipstick NEGATIVE NEGATIVE   Bilirubin Urine NEGATIVE NEGATIVE   Ketones, ur NEGATIVE NEGATIVE mg/dL   Protein, ur NEGATIVE NEGATIVE mg/dL   Nitrite NEGATIVE NEGATIVE   Leukocytes, UA TRACE (A) NEGATIVE   RBC / HPF 0-5 0 - 5 RBC/hpf   WBC, UA 0-5 0 - 5 WBC/hpf   Bacteria, UA RARE (A) NONE SEEN   Squamous Epithelial / LPF 0-5 (A) NONE SEEN   Mucus PRESENT   Pregnancy, urine POC     Status: Abnormal    Collection Time: 05/08/17  8:34 PM  Result Value Ref Range   Preg Test, Ur POSITIVE (A) NEGATIVE  Comprehensive metabolic panel     Status: Abnormal   Collection Time: 05/08/17  8:52 PM  Result Value Ref Range   Sodium 136 135 - 145 mmol/L   Potassium 4.2 3.5 - 5.1 mmol/L   Chloride 102 101 - 111 mmol/L   CO2 24 22 - 32 mmol/L   Glucose, Bld 126 (H) 65 - 99 mg/dL   BUN 12 6 - 20 mg/dL   Creatinine, Ser 1.61 0.44 - 1.00 mg/dL   Calcium 9.0 8.9 - 09.6 mg/dL   Total Protein 7.6 6.5 - 8.1 g/dL   Albumin 3.4 (L) 3.5 - 5.0 g/dL   AST 16 15 - 41 U/L   ALT 19 14 - 54 U/L   Alkaline Phosphatase 125 38 - 126 U/L   Total Bilirubin 0.2 (L) 0.3 - 1.2 mg/dL   GFR calc non Af Amer >60 >60 mL/min   GFR calc Af Amer >60 >60 mL/min   Anion gap 10 5 - 15  CBC     Status: Abnormal   Collection Time: 05/08/17  8:52 PM  Result Value Ref Range   WBC 12.8 (H) 4.0 - 10.5 K/uL   RBC 4.06 3.87 - 5.11 MIL/uL   Hemoglobin 11.5 (L) 12.0 - 15.0 g/dL   HCT 04.5 (L) 40.9 - 81.1 %   MCV 86.9 78.0 - 100.0 fL   MCH 28.3 26.0 - 34.0 pg   MCHC 32.6 30.0 - 36.0 g/dL   RDW 91.4 78.2 - 95.6 %   Platelets 358 150 - 400 K/uL   MDM Bedside ultrasound- limited due to body habitus. Visible gestational sac, no yolk sac or fetal pole  CBC, CMP ordered RUQ ultrasound ordered to rule out gallstones Dating ultrasound ordered given limited bedside ultrasound findings  Care turned over to Antony Odea, CNM  Peggy Constant 05/08/2017, 8:36 PM   US Ob Comp Less 14 Wks  Result Date: 05/08/2017 CLINICAL DATA:  Right upper quadrant pain, viability and dating EXAM: OBSTETRIC <  14 WK US AND TRANSVAGINAL OB US TECHNIQUE: Both transabdominal and transvaginal ultrasound examinations were performed for complete evaluation of the gestation as well as the maternal uterus, adnexal regions, and pelvic cul-de-sac. Transvaginal technique was performed to assess early pregnancy. COMPARISON:  None. FINDINGS: Intrauterine gestational  sac: Visible Yolk sac:  Visible Embryo:  Visible Cardiac Activity: Visible Heart Rate: 169 bpm CRL:  23.2 mm   9 w   0 d                  US EDC: 12/11/2017 Subchorionic hemorrhage: Small subchorionic hemorrhage along the anterior sac. Maternal uterus/adnexae: Ovaries are non visualized. No significant free fluid IMPRESSION: 1. Single viable intrauterine pregnancy dated at 9 weeks 0 days. 2. Small subchorionic hemorrhage Electronically Signed   By: Jasmine PangKim  Fujinaga M.D.   On: 05/08/2017 22:35   [place] Ob Transvaginal  Result Date: 05/08/2017 CLINICAL DATA:  Right upper quadrant pain, viability and dating EXAM: OBSTETRIC <14 WK US AND TRANSVAGINAL OB US TECHNIQUE: Both transabdominal and transvaginal ultrasound examinations were performed for complete evaluation of the gestation as well as the maternal uterus, adnexal regions, and pelvic cul-de-sac. Transvaginal technique was performed to assess early pregnancy. COMPARISON:  None. FINDINGS: Intrauterine gestational sac: Visible Yolk sac:  Visible Embryo:  Visible Cardiac Activity: Visible Heart Rate: 169 bpm CRL:  23.2 mm   9 w   0 d                  US EDC: 12/11/2017 Subchorionic hemorrhage: Small subchorionic hemorrhage along the anterior sac. Maternal uterus/adnexae: Ovaries are non visualized. No significant free fluid IMPRESSION: 1. Single viable intrauterine pregnancy dated at 9 weeks 0 days. 2. Small subchorionic hemorrhage Electronically Signed   By: Jasmine PangKim  Fujinaga M.D.   On: 05/08/2017 22:35   [place] Abdomen Limited Ruq  Result Date: 05/08/2017 CLINICAL DATA:  Right upper quadrant pain EXAM: ULTRASOUND ABDOMEN LIMITED RIGHT UPPER QUADRANT COMPARISON:  02/12/2015 FINDINGS: Gallbladder: No shadowing stones or sludge. Negative sonographic Murphy. Questionable tiny fluid near the gallbladder fundus. Normal wall thickness Common bile duct: Diameter: 5.9 mm Liver: No focal lesion identified. Increased hepatic echogenicity. Portal vein is patent on color Doppler  imaging with normal direction of blood flow towards the liver. IMPRESSION: 1. Negative for shadowing stones, wall thickening, or sonographic Murphy. Questionable tiny amount of fluid near the gallbladder fundus 2. Negative for biliary dilatation 3. Echogenic liver suggesting steatosis. Electronically Signed   By: Jasmine PangKim  Fujinaga M.D.   On: 05/08/2017 22:03   - No clear etiology for RUQ pain. Patient reports improvement of pain.   Assessment and Plan   1. Normal intrauterine pregnancy on prenatal ultrasound in first trimester   2. RUQ pain   3. Pregnancy with uncertain fetal viability   4. [redacted] weeks gestation of pregnancy   5. Subchorionic hemorrhage of placenta in first trimester, single or unspecified fetus    -Discharge home in stable condition -Vaginal bleeding precautions discussed -Patient advised to follow-up with [facility] as scheduled on April 12th -[facility]our[facility] or [facility] if right upper quadrant pain worsens -Patient may return to MAU as needed or if her condition were to change or worsen   Rolm BookbinderCaroline M Neill, CNM 05/08/17 11:00 PM

## 2018-03-03 ENCOUNTER — Encounter (HOSPITAL_COMMUNITY): Payer: Self-pay

## 2018-04-01 IMAGING — US US ABDOMEN LIMITED
1 series · 15 of 25 positions shown · non-contrast
Comparison: 02/12/2015

CLINICAL DATA: Right upper quadrant pain

EXAM:
ULTRASOUND ABDOMEN LIMITED RIGHT UPPER QUADRANT

[Series 1: us abdomen limited · 15 of 56 slices shown]
[im 1/56]
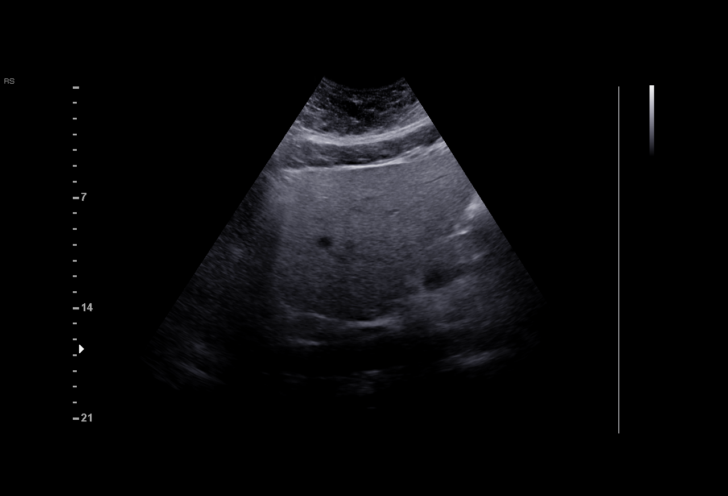
[im 5/56]
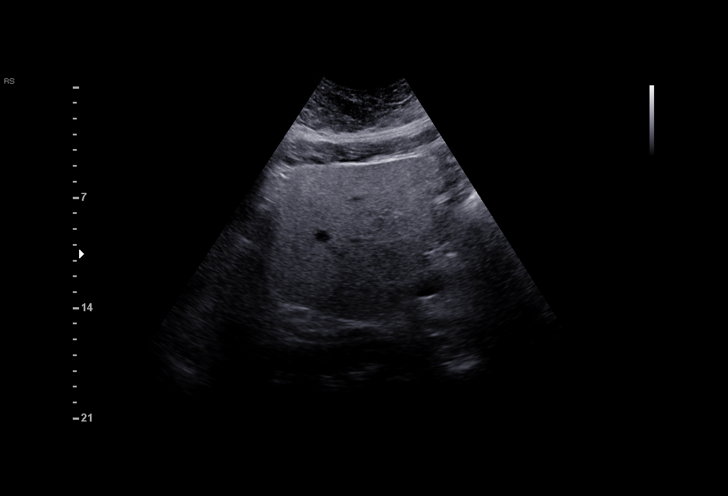
[im 10/56]
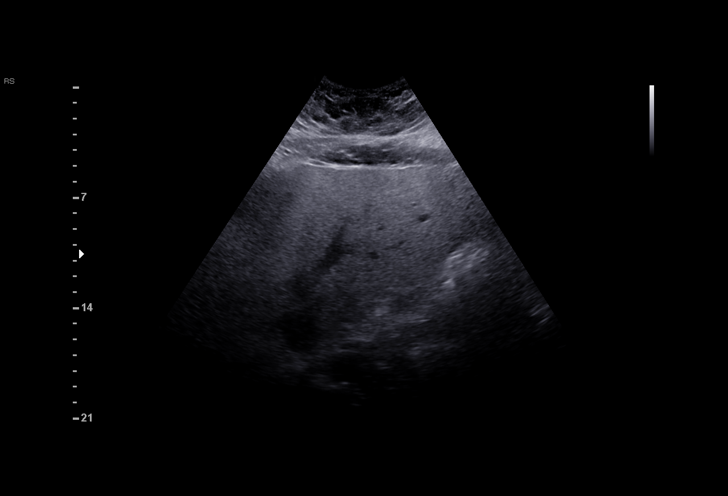
[im 12/56]
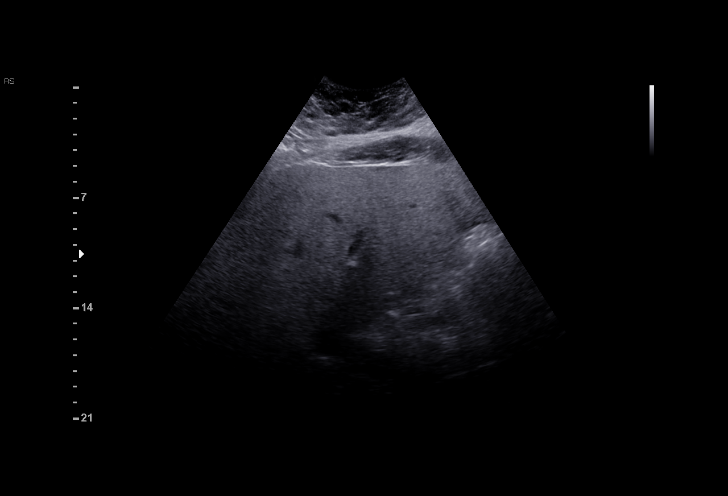
[im 17/56]
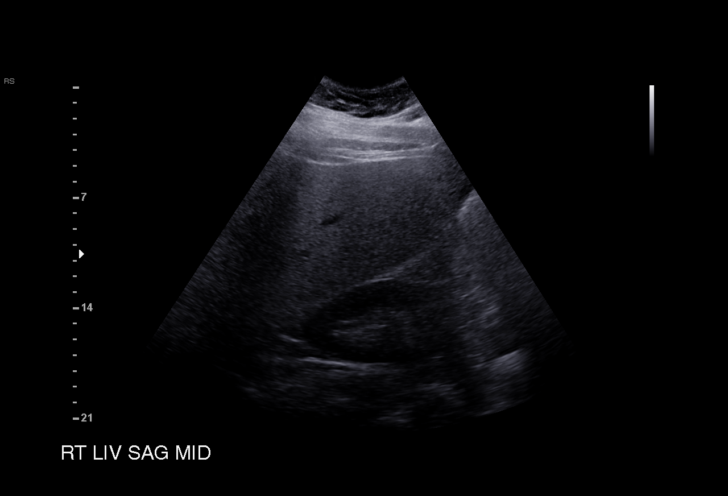
[im 21/56]
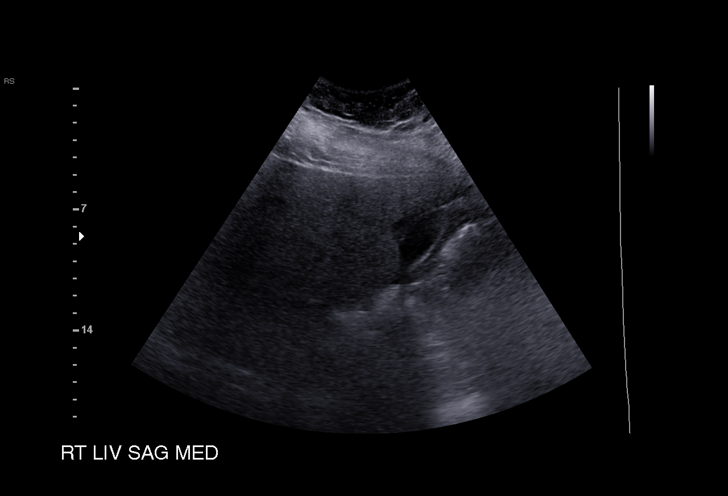
[im 23/56]
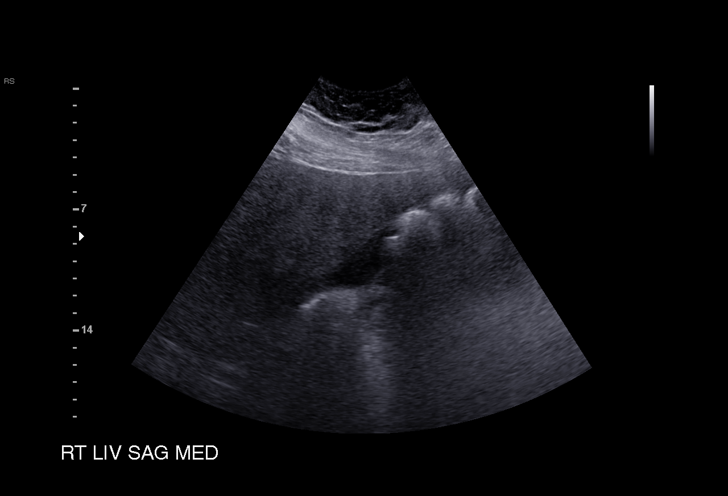
[im 28/56]
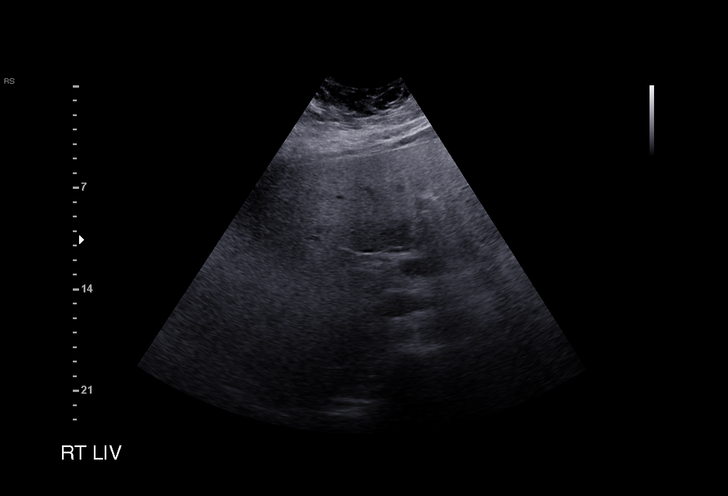
[im 33/56]
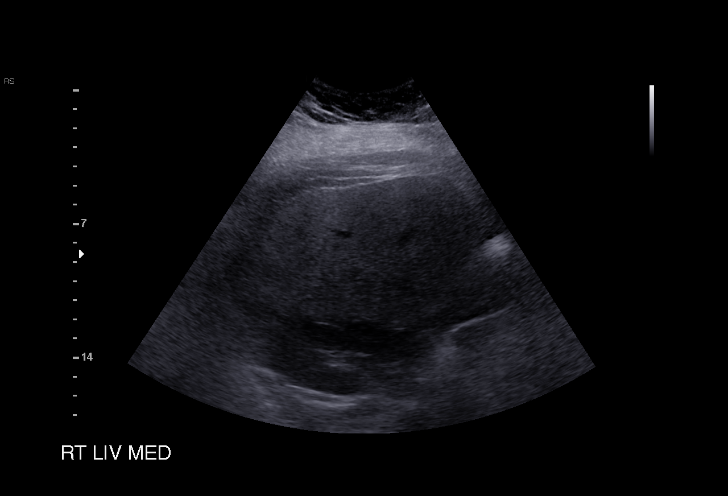
[im 35/56]
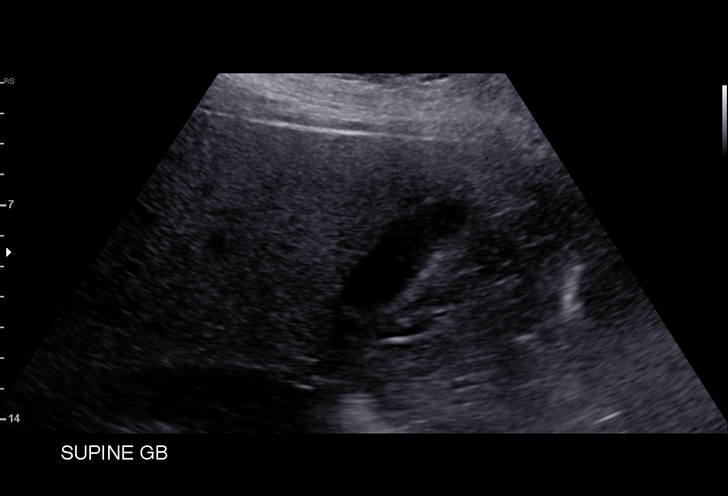
[im 39/56]
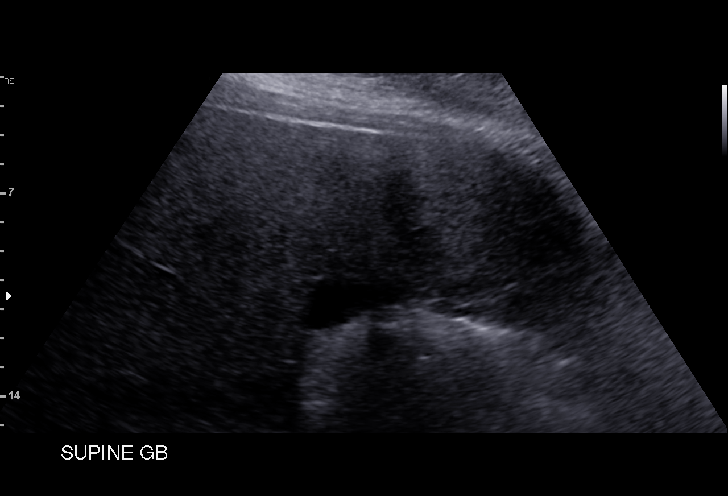
[im 44/56]
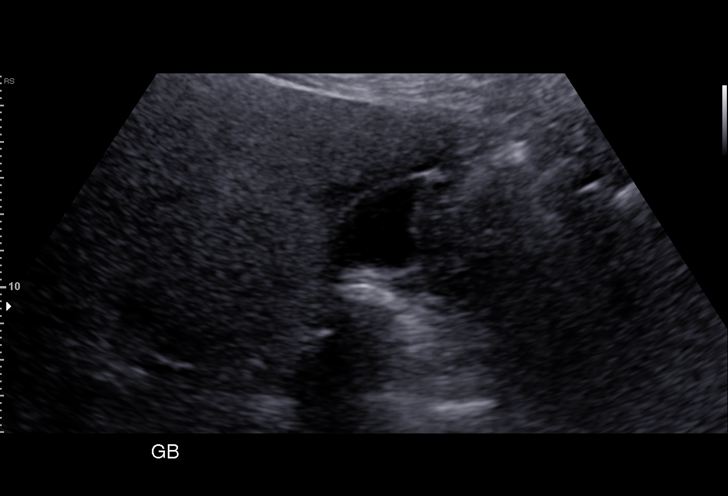
[im 46/56]
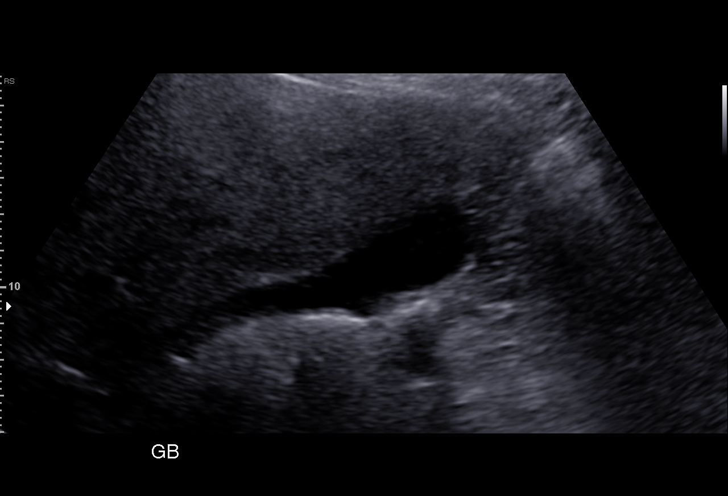
[im 51/56]
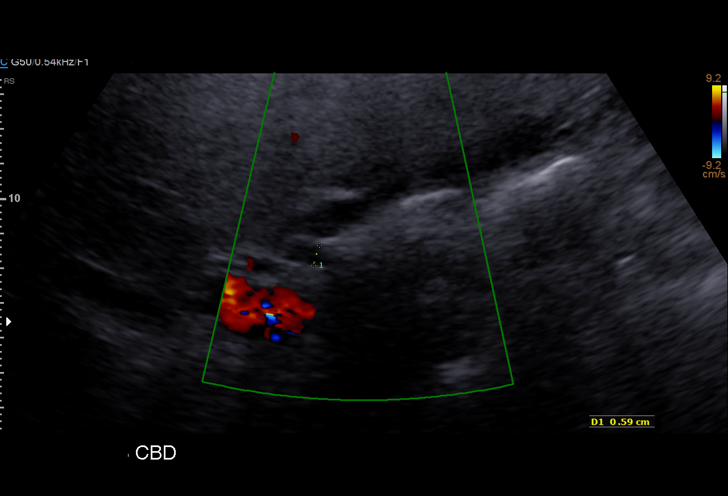
[im 56/56]
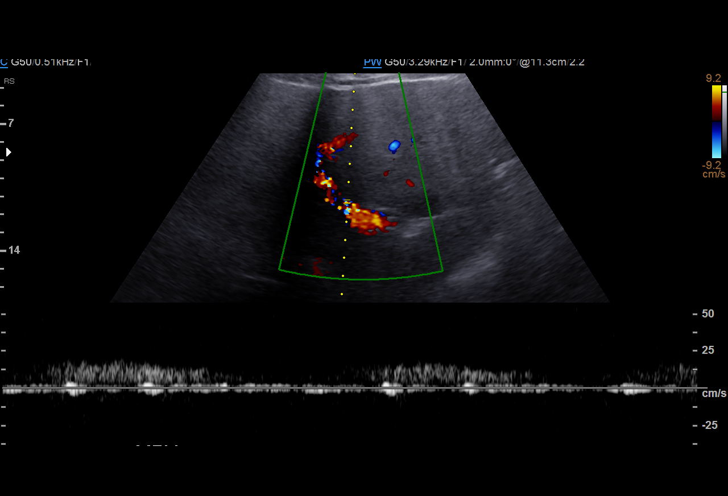

[15 of 25 positions shown; findings below may reference images not displayed]

FINDINGS: Gallbladder:

No shadowing stones or sludge. Negative sonographic Murphy.
Questionable tiny fluid near the gallbladder fundus. Normal wall
thickness

Common bile duct:

Diameter: 5.9 mm

Liver:

No focal lesion identified. Increased hepatic echogenicity.. Portal
vein is patent on color Doppler imaging with normal direction of
blood flow towards the liver.
IMPRESSION: 1. Negative for shadowing stones, wall thickening, or sonographic
Murphy. Questionable tiny amount of fluid near the gallbladder
fundus
2. Negative for biliary dilatation
3. Echogenic liver suggesting steatosis.

## 2018-04-01 IMAGING — US US OB TRANSVAGINAL
1 series · 15 of 28 positions shown · non-contrast
Comparison: None.

CLINICAL DATA: Right upper quadrant pain, viability and dating

EXAM:
OBSTETRIC <14 WK US AND TRANSVAGINAL OB US
TECHNIQUE: Both transabdominal and transvaginal ultrasound examinations were
performed for complete evaluation of the gestation as well as the
maternal uterus, adnexal regions, and pelvic cul-de-sac.
Transvaginal technique was performed to assess early pregnancy.

[Series 1: us ob transvaginal · 15 of 57 slices shown]
[im 1/57]
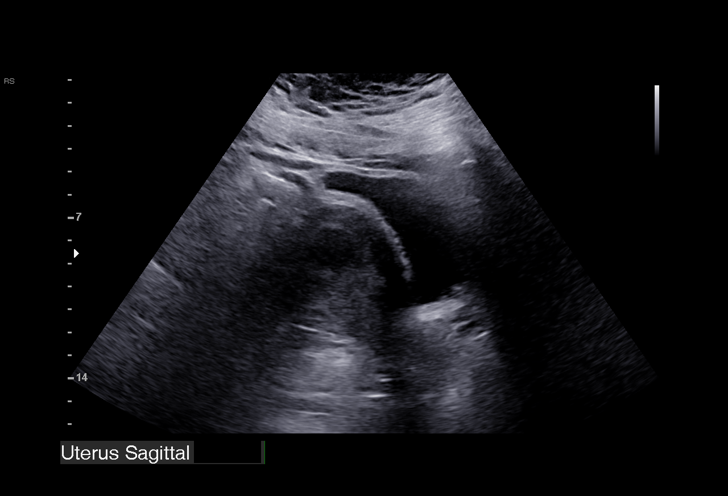
[im 5/57]
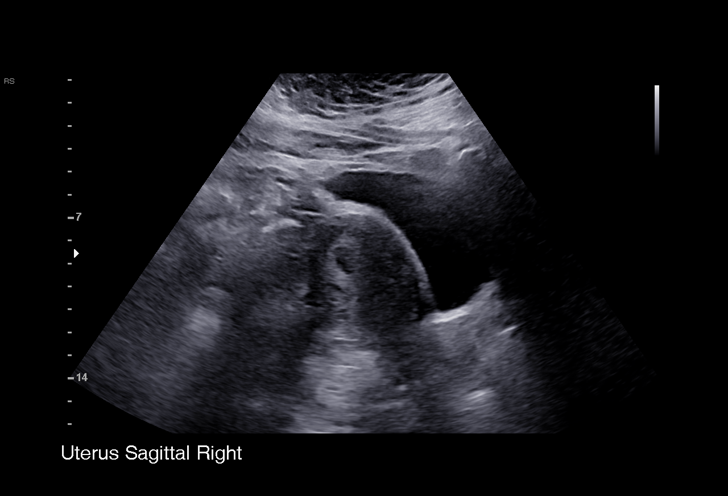
[im 9/57]
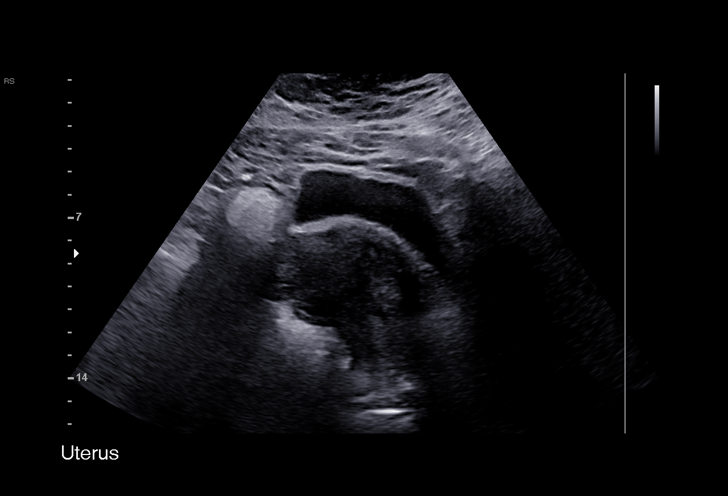
[im 13/57]
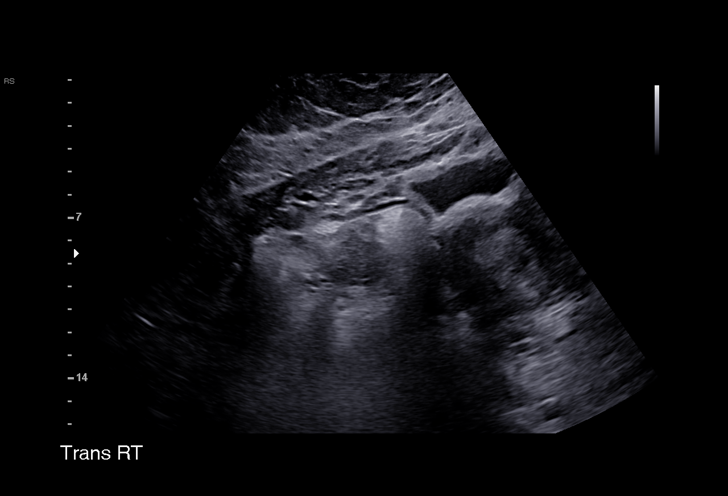
[im 17/57]
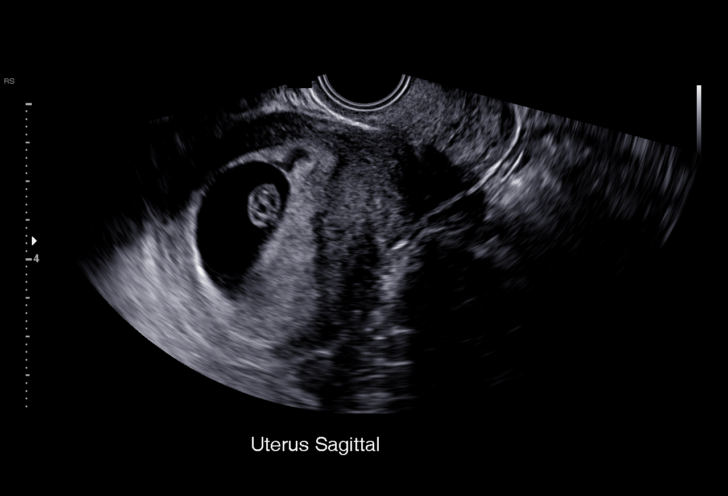
[im 21/57]
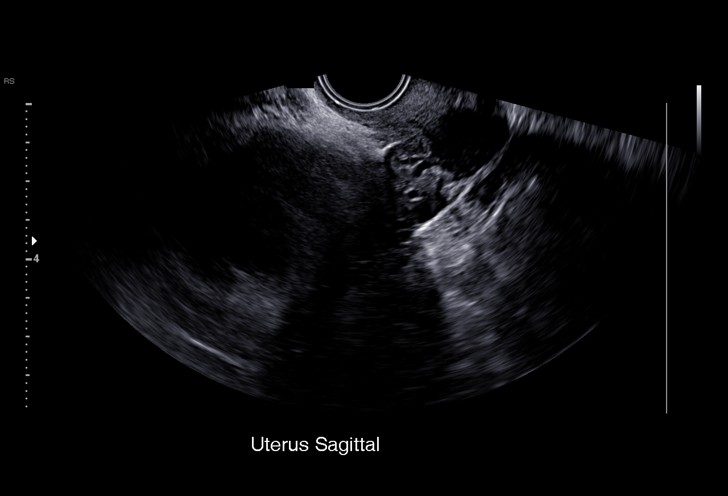
[im 25/57]
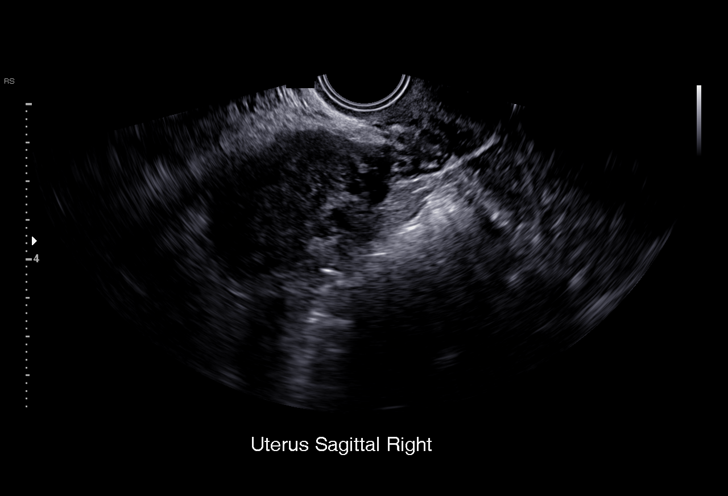
[im 30/57]
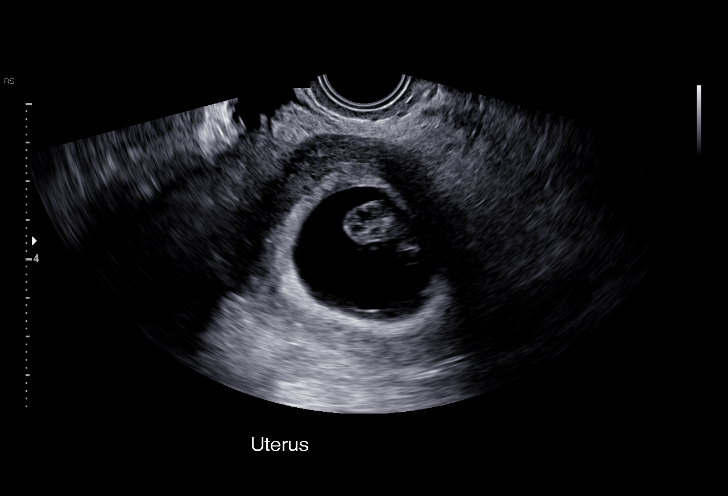
[im 32/57]
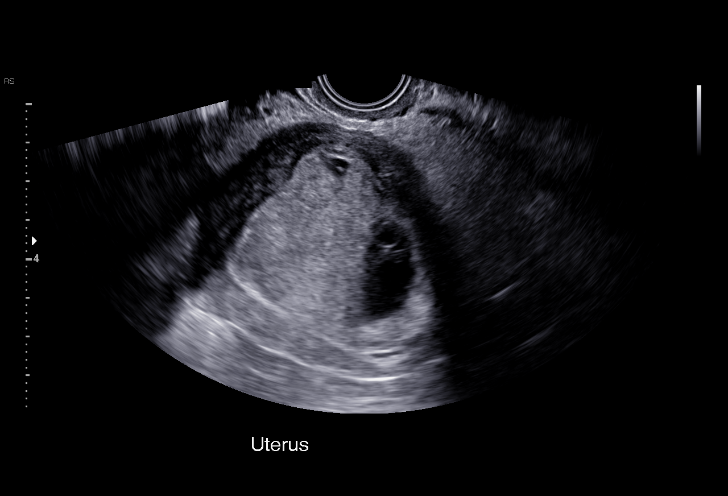
[im 36/57]
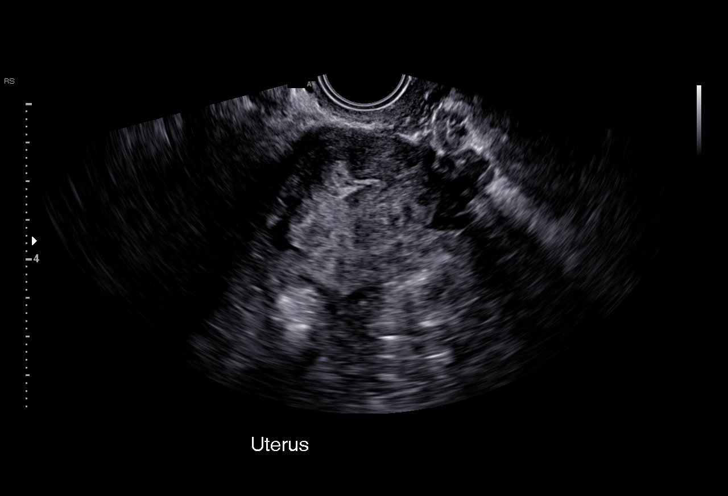
[im 40/57]
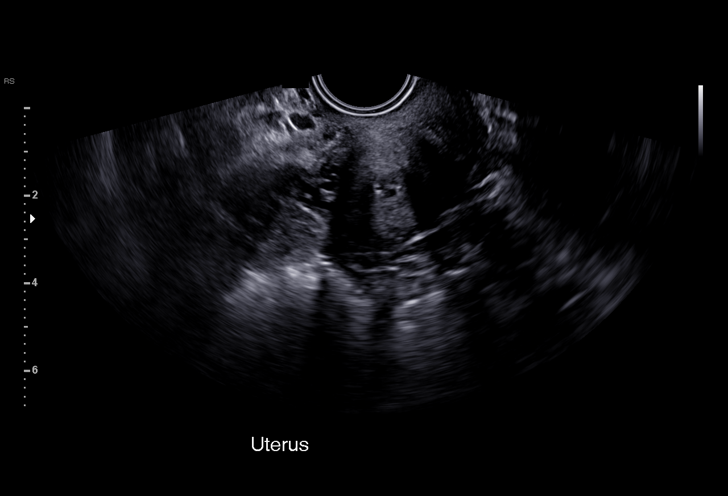
[im 44/57]
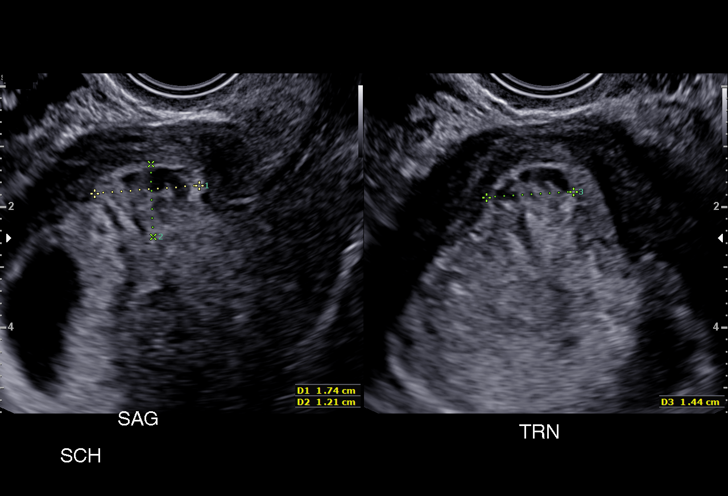
[im 48/57]
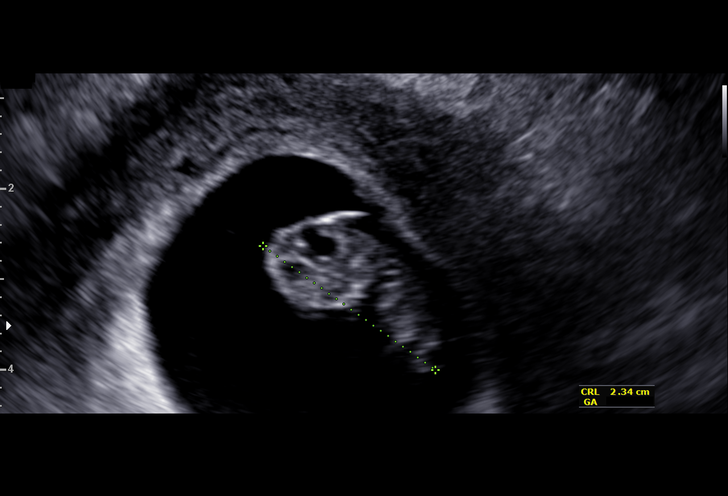
[im 52/57]
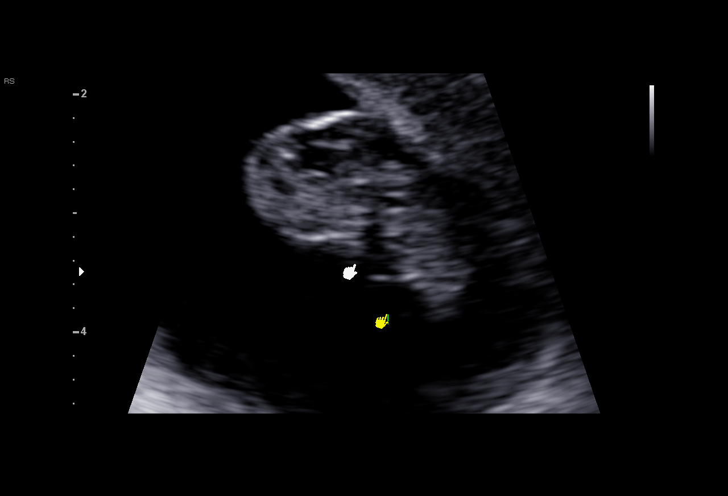
[im 57/57]
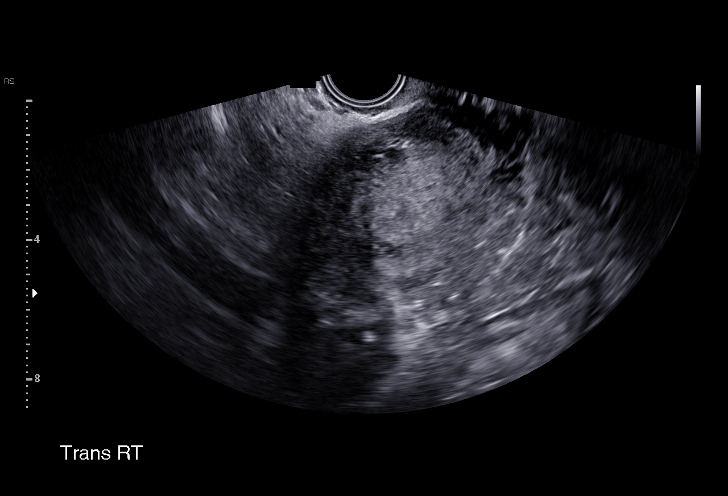

[15 of 28 positions shown; findings below may reference images not displayed]

FINDINGS: Intrauterine gestational sac: Visible

Yolk sac:  Visible

Embryo:  Visible

Cardiac Activity: Visible

Heart Rate: 169 bpm

CRL:  23.2 mm   9 w   0 d                  US EDC: 12/11/2017

Subchorionic hemorrhage: Small subchorionic hemorrhage along the
anterior sac.

Maternal uterus/adnexae: Ovaries are non visualized. No significant
free fluid
IMPRESSION: 1. Single viable intrauterine pregnancy dated at 9 weeks 0 days.
2. Small subchorionic hemorrhage

## 2018-10-12 HISTORY — PX: HERNIA REPAIR: SHX51

## 2023-11-17 ENCOUNTER — Telehealth: Payer: Self-pay | Admitting: Hematology and Oncology

## 2023-11-17 ENCOUNTER — Inpatient Hospital Stay

## 2023-11-17 ENCOUNTER — Encounter: Payer: Self-pay | Admitting: Hematology and Oncology

## 2023-11-17 ENCOUNTER — Inpatient Hospital Stay: Attending: Hematology and Oncology | Admitting: Hematology and Oncology

## 2023-11-17 ENCOUNTER — Other Ambulatory Visit: Payer: Self-pay

## 2023-11-17 ENCOUNTER — Other Ambulatory Visit: Payer: Self-pay | Admitting: Hematology and Oncology

## 2023-11-17 VITALS — BP 141/73 | HR 73 | Temp 97.9°F | Resp 14 | Ht 68.0 in | Wt 214.0 lb

## 2023-11-17 DIAGNOSIS — D509 Iron deficiency anemia, unspecified: Secondary | ICD-10-CM | POA: Diagnosis present

## 2023-11-17 DIAGNOSIS — D649 Anemia, unspecified: Secondary | ICD-10-CM

## 2023-11-17 DIAGNOSIS — D500 Iron deficiency anemia secondary to blood loss (chronic): Secondary | ICD-10-CM | POA: Insufficient documentation

## 2023-11-17 LAB — CBC WITH DIFFERENTIAL (CANCER CENTER ONLY)
Abs Immature Granulocytes: 0.02 K/uL (ref 0.00–0.07)
Basophils Absolute: 0.1 K/uL (ref 0.0–0.1)
Basophils Relative: 1 %
Eosinophils Absolute: 0.1 K/uL (ref 0.0–0.5)
Eosinophils Relative: 2 %
HCT: 32.1 % — ABNORMAL LOW (ref 36.0–46.0)
Hemoglobin: 9.9 g/dL — ABNORMAL LOW (ref 12.0–15.0)
Immature Granulocytes: 0 %
Lymphocytes Relative: 29 %
Lymphs Abs: 2.1 K/uL (ref 0.7–4.0)
MCH: 24.4 pg — ABNORMAL LOW (ref 26.0–34.0)
MCHC: 30.8 g/dL (ref 30.0–36.0)
MCV: 79.3 fL — ABNORMAL LOW (ref 80.0–100.0)
Monocytes Absolute: 0.5 K/uL (ref 0.1–1.0)
Monocytes Relative: 7 %
Neutro Abs: 4.6 K/uL (ref 1.7–7.7)
Neutrophils Relative %: 61 %
Platelet Count: 385 K/uL (ref 150–400)
RBC: 4.05 MIL/uL (ref 3.87–5.11)
RDW: 19.5 % — ABNORMAL HIGH (ref 11.5–15.5)
WBC Count: 7.4 K/uL (ref 4.0–10.5)
nRBC: 0 % (ref 0.0–0.2)

## 2023-11-17 LAB — FOLATE: Folate: 7.2 ng/mL (ref >5.9)

## 2023-11-17 LAB — CMP (CANCER CENTER ONLY)
ALT: 9 U/L (ref 0–44)
AST: 15 U/L (ref 15–41)
Albumin: 4.2 g/dL (ref 3.5–5.0)
Alkaline Phosphatase: 95 U/L (ref 38–126)
Anion gap: 10 (ref 5–15)
BUN: 12 mg/dL (ref 6–20)
CO2: 25 mmol/L (ref 22–32)
Calcium: 9.2 mg/dL (ref 8.9–10.3)
Chloride: 101 mmol/L (ref 98–111)
Creatinine: 0.6 mg/dL (ref 0.44–1.00)
GFR, Estimated: >60 mL/min (ref >60)
Glucose, Bld: 95 mg/dL (ref 70–99)
Potassium: 3.7 mmol/L (ref 3.5–5.1)
Sodium: 137 mmol/L (ref 135–145)
Total Bilirubin: 0.2 mg/dL (ref 0.0–1.2)
Total Protein: 7.3 g/dL (ref 6.5–8.1)

## 2023-11-17 LAB — FERRITIN: Ferritin: 7 ng/mL — ABNORMAL LOW (ref 11–307)

## 2023-11-17 LAB — IRON AND TIBC
Iron: 14 ug/dL — ABNORMAL LOW (ref 28–170)
Saturation Ratios: 3 % — ABNORMAL LOW (ref 10.4–31.8)
TIBC: 493 ug/dL — ABNORMAL HIGH (ref 250–450)
UIBC: 479 ug/dL

## 2023-11-17 LAB — TSH: TSH: 1.81 u[IU]/mL (ref 0.350–4.500)

## 2023-11-17 LAB — VITAMIN B12: Vitamin B-12: 458 pg/mL (ref 180–914)

## 2023-11-17 NOTE — Telephone Encounter (Signed)
 Patient has been scheduled for follow-up visit per 11/17/23 LOS.  Pt noted appt details on personal electronic device.

## 2023-11-18 ENCOUNTER — Other Ambulatory Visit: Payer: Self-pay | Admitting: Pharmacist

## 2023-11-19 ENCOUNTER — Inpatient Hospital Stay

## 2023-11-19 VITALS — BP 116/65 | HR 86 | Temp 98.0°F | Resp 18

## 2023-11-19 DIAGNOSIS — D500 Iron deficiency anemia secondary to blood loss (chronic): Secondary | ICD-10-CM

## 2023-11-19 DIAGNOSIS — D509 Iron deficiency anemia, unspecified: Secondary | ICD-10-CM | POA: Diagnosis not present

## 2023-11-19 MED ORDER — LORATADINE 10 MG PO TABS
10.0000 mg | ORAL_TABLET | Freq: Once | ORAL | Status: AC
  Administered 2023-11-19: 10 mg via ORAL
  Filled 2023-11-19: qty 1

## 2023-11-19 MED ORDER — ACETAMINOPHEN 325 MG PO TABS
650.0000 mg | ORAL_TABLET | Freq: Once | ORAL | Status: AC
  Administered 2023-11-19: 650 mg via ORAL
  Filled 2023-11-19: qty 2

## 2023-11-19 MED ORDER — ACETAMINOPHEN 325 MG PO TABS
ORAL_TABLET | ORAL | Status: AC
  Filled 2023-11-19: qty 2

## 2023-11-19 MED ORDER — SODIUM CHLORIDE 0.9 % IV SOLN: INTRAVENOUS | Status: DC

## 2023-11-19 MED ORDER — SODIUM CHLORIDE 0.9 % IV SOLN
1000.0000 mg | Freq: Once | INTRAVENOUS | Status: AC
  Administered 2023-11-19: 1000 mg via INTRAVENOUS
  Filled 2023-11-19: qty 10

## 2023-11-19 NOTE — Patient Instructions (Signed)

## 2023-11-20 LAB — MULTIPLE MYELOMA PANEL, SERUM
Albumin SerPl Elph-Mcnc: 3.5 g/dL (ref 2.9–4.4)
Albumin/Glob SerPl: 1.1 (ref 0.7–1.7)
Alpha 1: 0.3 g/dL (ref 0.0–0.4)
Alpha2 Glob SerPl Elph-Mcnc: 0.8 g/dL (ref 0.4–1.0)
B-Globulin SerPl Elph-Mcnc: 1.2 g/dL (ref 0.7–1.3)
Gamma Glob SerPl Elph-Mcnc: 1.1 g/dL (ref 0.4–1.8)
Globulin, Total: 3.4 g/dL (ref 2.2–3.9)
IgA: 397 mg/dL — ABNORMAL HIGH (ref 87–352)
IgG (Immunoglobin G), Serum: 1097 mg/dL (ref 586–1602)
IgM (Immunoglobulin M), Srm: 134 mg/dL (ref 26–217)
Total Protein ELP: 6.9 g/dL (ref 6.0–8.5)

## 2023-12-17 ENCOUNTER — Encounter: Payer: Self-pay | Admitting: Hematology and Oncology

## 2023-12-17 ENCOUNTER — Inpatient Hospital Stay

## 2023-12-17 ENCOUNTER — Other Ambulatory Visit: Payer: Self-pay

## 2023-12-17 ENCOUNTER — Inpatient Hospital Stay: Attending: Hematology and Oncology | Admitting: Hematology and Oncology

## 2023-12-17 ENCOUNTER — Telehealth: Payer: Self-pay | Admitting: Hematology and Oncology

## 2023-12-17 ENCOUNTER — Other Ambulatory Visit: Payer: Self-pay | Admitting: Hematology and Oncology

## 2023-12-17 VITALS — BP 107/61 | HR 84 | Temp 98.2°F | Resp 16 | Ht 68.0 in | Wt 207.7 lb

## 2023-12-17 DIAGNOSIS — E039 Hypothyroidism, unspecified: Secondary | ICD-10-CM | POA: Diagnosis not present

## 2023-12-17 DIAGNOSIS — Z79899 Other long term (current) drug therapy: Secondary | ICD-10-CM | POA: Insufficient documentation

## 2023-12-17 DIAGNOSIS — D509 Iron deficiency anemia, unspecified: Secondary | ICD-10-CM | POA: Insufficient documentation

## 2023-12-17 DIAGNOSIS — Z800 Family history of malignant neoplasm of digestive organs: Secondary | ICD-10-CM | POA: Diagnosis not present

## 2023-12-17 DIAGNOSIS — D500 Iron deficiency anemia secondary to blood loss (chronic): Secondary | ICD-10-CM

## 2023-12-17 DIAGNOSIS — Z803 Family history of malignant neoplasm of breast: Secondary | ICD-10-CM | POA: Insufficient documentation

## 2023-12-17 DIAGNOSIS — Z87891 Personal history of nicotine dependence: Secondary | ICD-10-CM | POA: Insufficient documentation

## 2023-12-17 LAB — CMP (CANCER CENTER ONLY)
ALT: 6 U/L (ref 0–44)
AST: 13 U/L — ABNORMAL LOW (ref 15–41)
Albumin: 4.4 g/dL (ref 3.5–5.0)
Alkaline Phosphatase: 109 U/L (ref 38–126)
Anion gap: 12 (ref 5–15)
BUN: 13 mg/dL (ref 6–20)
CO2: 26 mmol/L (ref 22–32)
Calcium: 9.6 mg/dL (ref 8.9–10.3)
Chloride: 102 mmol/L (ref 98–111)
Creatinine: 0.66 mg/dL (ref 0.44–1.00)
GFR, Estimated: >60 mL/min (ref >60)
Glucose, Bld: 83 mg/dL (ref 70–99)
Potassium: 4 mmol/L (ref 3.5–5.1)
Sodium: 140 mmol/L (ref 135–145)
Total Bilirubin: 0.3 mg/dL (ref 0.0–1.2)
Total Protein: 7.6 g/dL (ref 6.5–8.1)

## 2023-12-17 LAB — CBC WITH DIFFERENTIAL (CANCER CENTER ONLY)
Abs Immature Granulocytes: 0.02 K/uL (ref 0.00–0.07)
Basophils Absolute: 0.1 K/uL (ref 0.0–0.1)
Basophils Relative: 1 %
Eosinophils Absolute: 0.1 K/uL (ref 0.0–0.5)
Eosinophils Relative: 2 %
HCT: 39.1 % (ref 36.0–46.0)
Hemoglobin: 12.3 g/dL (ref 12.0–15.0)
Immature Granulocytes: 0 %
Lymphocytes Relative: 21 %
Lymphs Abs: 1.7 K/uL (ref 0.7–4.0)
MCH: 26.6 pg (ref 26.0–34.0)
MCHC: 31.5 g/dL (ref 30.0–36.0)
MCV: 84.4 fL (ref 80.0–100.0)
Monocytes Absolute: 0.5 K/uL (ref 0.1–1.0)
Monocytes Relative: 7 %
Neutro Abs: 5.6 K/uL (ref 1.7–7.7)
Neutrophils Relative %: 69 %
Platelet Count: 341 K/uL (ref 150–400)
RBC: 4.63 MIL/uL (ref 3.87–5.11)
RDW: 22.6 % — ABNORMAL HIGH (ref 11.5–15.5)
WBC Count: 8 K/uL (ref 4.0–10.5)
nRBC: 0 % (ref 0.0–0.2)

## 2023-12-17 LAB — VITAMIN B12: Vitamin B-12: 386 pg/mL (ref 180–914)

## 2023-12-17 LAB — FOLATE: Folate: 5.3 ng/mL — ABNORMAL LOW (ref >5.9)

## 2023-12-17 LAB — TSH: TSH: 1.9 u[IU]/mL (ref 0.350–4.500)

## 2023-12-17 LAB — IRON AND TIBC
Iron: 51 ug/dL (ref 28–170)
Saturation Ratios: 14 % (ref 10.4–31.8)
TIBC: 356 ug/dL (ref 250–450)
UIBC: 305 ug/dL

## 2023-12-17 LAB — FERRITIN: Ferritin: 191 ng/mL (ref 11–307)

## 2023-12-17 NOTE — Progress Notes (Signed)
 [facility] CONSULT NOTE  Patient Care Team: Nodal, Mickey Browner, PA-C as PCP - General (Physician Assistant)  ASSESSMENT & PLAN:  Anemia: She presents today for repeat evaluation after receiving IV iron in the form of Monferric. She notes improvement in symptoms, mainly having more energy and no longer craving ice for the last few weeks. Hemoglobin today has increased from 9.9 to 12.3 today. Iron studies are pending, but I expect these to have improved as well. She will return to clinic in 3 months for repeat evaluation.   All questions were answered. The patient knows to call the clinic with any problems, questions or concerns.  The total time spent in the appointment was 45 minutes encounter with patients including review of chart and various tests results, discussions about plan of care and coordination of care plan  Kristy DELENA Bach, NP 11/6/202510:57 AM   CHIEF COMPLAINTS/PURPOSE OF CONSULTATION:  Anemia  HISTORY OF PRESENTING ILLNESS:  Kristy Castro 43 y.o. female is here because of anemia  She was found to have abnormal CBC from 10/07 She denies recent chest pain on exertion, pre-syncopal episodes, or palpitations. She does note shortness of breath as well as fatigue. She had not noticed any recent bleeding such as epistaxis, hematuria or hematochezia The patient denies over the counter NSAID ingestion. She is not  on antiplatelets agents. Her last colonoscopy was N/A She had no prior history or diagnosis of cancer. Her age appropriate screening programs are up-to-date. She does have pica to crushed ice, stating she eats several large cups a day.  She never donated blood or received blood transfusion The patient was not prescribed oral iron supplements.  MEDICAL HISTORY:  Past Medical History:  Diagnosis Date   ADHD    Anemia 02/11/2008   AFTER DELIVERY OF FIRST CHILD   Cellulitis    Chronic bilateral thoracic back pain    Depression    Eczema     Extensor tendonitis of foot    GAD (generalized anxiety disorder)    Headache(784.0)    OCCASIONALLY WITH PREGNANCY   Hypothyroidism    ON SYNTHROID 50 MCG   Lesion of both lower eyelids    Myalgia    Panic disorder    S/P laparoscopic sleeve gastrectomy    S/P panniculectomy    Subcutaneous nodule of lower extremity    Thyroid disease 12/11/2009   HYPOTHYROID   Uncontrolled pain    Ventral hernia     SURGICAL HISTORY: Past Surgical History:  Procedure Laterality Date   BREAST REDUCTION SURGERY     DILATION AND CURETTAGE OF UTERUS     HERNIA REPAIR     HERNIA REPAIR  10/2018   INCISION AND DRAINAGE     INCISIONAL HERNIA REPAIR     PANNICULECTOMY     REVISION OF ABDOMINAL SCAR     SALPINGECTOMY     SLEEVE GASTROPLASTY     TUBAL LIGATION     WISDOM TOOTH EXTRACTION  11/2010, 2004   2 REMOVED LT UPPER AND LOWER    SOCIAL HISTORY: Social History   Socioeconomic History   Marital status: Married    Spouse name: Not on file   Number of children: Not on file   Years of education: Not on file   Highest education level: Not on file  Occupational History   Not on file  Tobacco Use   Smoking status: Former    Current packs/day: 0.25    Average packs/day: 0.3 packs/day for  10.0 years (2.5 ttl pk-yrs)    Types: Cigarettes   Smokeless tobacco: Former    Quit date: 04/18/2011  Vaping Use   Vaping status: Never Used  Substance and Sexual Activity   Alcohol use: Yes    Comment: Rare   Drug use: No   Sexual activity: Never    Birth control/protection: None  Other Topics Concern   Not on file  Social History Narrative   Not on file   Social Drivers of Health   Financial Resource Strain: Not on file  Food Insecurity: Food Insecurity Present (11/17/2023)   Hunger Vital Sign    Worried About Running Out of Food in the Last Year: Sometimes true    Ran Out of Food in the Last Year: Sometimes true  Transportation Needs: No Transportation Needs (11/17/2023)   PRAPARE -  Administrator, Civil Service (Medical): No    Lack of Transportation (Non-Medical): No  Physical Activity: Not on file  Stress: Not on file  Social Connections: Not on file  Intimate Partner Violence: Not At Risk (11/17/2023)   Humiliation, Afraid, Rape, and Kick questionnaire    Fear of Current or Ex-Partner: No    Emotionally Abused: No    Physically Abused: No    Sexually Abused: No    FAMILY HISTORY: Family History  Problem Relation Age of Onset   Heart disease Father    Asthma Father    Emphysema Father    Diabetes type II Father        USES INSULIN   Colon cancer Father    Depression Father    Depression Sister    Asthma Sister    Seizures Brother        AS A CHILD   Breast cancer Maternal Aunt    Diabetes type II Paternal Aunt    Liver cancer Paternal Uncle    Dementia Maternal Grandmother    Parkinsonism Paternal Grandmother    Diabetes type II Paternal Grandfather    Asthma Son        WITH A COLD ONLY    ALLERGIES:  is allergic to morphine and erythromycin base.  MEDICATIONS:  Current Outpatient Medications  Medication Sig Dispense Refill   amphetamine-dextroamphetamine (ADDERALL) 30 MG tablet Take 30 mg by mouth 2 (two) times daily.     buPROPion (WELLBUTRIN XL) 300 MG 24 hr tablet Take 300 mg by mouth daily.     MAGNESIUM OXIDE, ELEMENTAL, PO Take by mouth.     melatonin 3 MG TABS tablet Take 6 mg by mouth.     naproxen sodium (ALEVE) 220 MG tablet Take 220 mg by mouth as needed.     WEGOVY 1.7 MG/0.75ML SOAJ SQ injection Inject 1.7 mg into the skin once a week.     No current facility-administered medications for this visit.    REVIEW OF SYSTEMS:   Constitutional: Denies fevers, chills or abnormal night sweats Eyes: Denies blurriness of vision, double vision or watery eyes Ears, nose, mouth, throat, and face: Denies mucositis or sore throat Respiratory: Denies cough, dyspnea or wheezes Cardiovascular: Denies palpitation, chest  discomfort or lower extremity swelling Gastrointestinal:  Denies nausea, heartburn or change in bowel habits Skin: Denies abnormal skin rashes Lymphatics: Denies new lymphadenopathy or easy bruising Neurological:Denies numbness, tingling or new weaknesses Behavioral/Psych: Mood is stable, no new changes  All other systems were reviewed with the patient and are negative.  PHYSICAL EXAMINATION: ECOG PERFORMANCE STATUS: 1 - Symptomatic but completely ambulatory  Vitals:   12/17/23 0947  BP: 107/61  Pulse: 84  Resp: 16  Temp: 98.2 F (36.8 C)  SpO2: 99%   Filed Weights   12/17/23 0947  Weight: 207 lb 11.2 oz (94.2 kg)    GENERAL:alert, no distress and comfortable SKIN: skin color, texture, turgor are normal, no rashes or significant lesions EYES: normal, conjunctiva are pink and non-injected, sclera clear OROPHARYNX:no exudate, no erythema and lips, buccal mucosa, and tongue normal  NECK: supple, thyroid normal size, non-tender, without nodularity LYMPH:  no palpable lymphadenopathy in the cervical, axillary or inguinal LUNGS: clear to auscultation and percussion with normal breathing effort HEART: regular rate & rhythm and no murmurs and no lower extremity edema ABDOMEN:abdomen soft, non-tender and normal bowel sounds Musculoskeletal:no cyanosis of digits and no clubbing  PSYCH: alert & oriented x 3 with fluent speech NEURO: no focal motor/sensory deficits  RADIOGRAPHIC STUDIES: I have personally reviewed the radiological images as listed and agreed with the findings in the report. No results found.

## 2023-12-17 NOTE — Progress Notes (Signed)
 [facility] CONSULT NOTE  Patient Care Team: Nodal, Mickey Browner, PA-C as PCP - General (Physician Assistant)  ASSESSMENT & PLAN:  Anemia: She reports increasing symptoms of iron deficiency including fatigue, shortness of breath and craving crushed ice. She states she goes to the gas station 3 times a day for 2 large cups of crushed ice. CBC today reveals hemoglobin 9.9 with iron studies revealing saturation 3 and ferritin 7. We discussed IV iron as a treatment course and she is agreeable to this plan. We discussed potential side effects including infusion reaction. We will plan for IV iron in the form of Monoferric and have her return in 4 weeks for repeat evaluation.    All questions were answered. The patient knows to call the clinic with any problems, questions or concerns.  The total time spent in the appointment was 45 minutes encounter with patients including review of chart and various tests results, discussions about plan of care and coordination of care plan  Kristy DELENA Bach, NP 11/6/202510:46 AM   CHIEF COMPLAINTS/PURPOSE OF CONSULTATION:  Anemia  HISTORY OF PRESENTING ILLNESS:  Kristy Castro 43 y.o. female is here because of anemia  She was found to have abnormal CBC from 10/07 She denies recent chest pain on exertion, pre-syncopal episodes, or palpitations. She does note shortness of breath as well as fatigue. She had not noticed any recent bleeding such as epistaxis, hematuria or hematochezia The patient denies over the counter NSAID ingestion. She is not  on antiplatelets agents. Her last colonoscopy was N/A She had no prior history or diagnosis of cancer. Her age appropriate screening programs are up-to-date. She does have pica to crushed ice, stating she eats several large cups a day.  She never donated blood or received blood transfusion The patient was not prescribed oral iron supplements.  MEDICAL HISTORY:  Past Medical History:  Diagnosis  Date   ADHD    Anemia 02/11/2008   AFTER DELIVERY OF FIRST CHILD   Cellulitis    Chronic bilateral thoracic back pain    Depression    Eczema    Extensor tendonitis of foot    GAD (generalized anxiety disorder)    Headache(784.0)    OCCASIONALLY WITH PREGNANCY   Hypothyroidism    ON SYNTHROID 50 MCG   Lesion of both lower eyelids    Myalgia    Panic disorder    S/P laparoscopic sleeve gastrectomy    S/P panniculectomy    Subcutaneous nodule of lower extremity    Thyroid disease 12/11/2009   HYPOTHYROID   Uncontrolled pain    Ventral hernia     SURGICAL HISTORY: Past Surgical History:  Procedure Laterality Date   BREAST REDUCTION SURGERY     DILATION AND CURETTAGE OF UTERUS     HERNIA REPAIR     HERNIA REPAIR  10/2018   INCISION AND DRAINAGE     INCISIONAL HERNIA REPAIR     PANNICULECTOMY     REVISION OF ABDOMINAL SCAR     SALPINGECTOMY     SLEEVE GASTROPLASTY     TUBAL LIGATION     WISDOM TOOTH EXTRACTION  11/2010, 2004   2 REMOVED LT UPPER AND LOWER    SOCIAL HISTORY: Social History   Socioeconomic History   Marital status: Married    Spouse name: Not on file   Number of children: Not on file   Years of education: Not on file   Highest education level: Not on file  Occupational History  Not on file  Tobacco Use   Smoking status: Former    Current packs/day: 0.25    Average packs/day: 0.3 packs/day for 10.0 years (2.5 ttl pk-yrs)    Types: Cigarettes   Smokeless tobacco: Former    Quit date: 04/18/2011  Vaping Use   Vaping status: Never Used  Substance and Sexual Activity   Alcohol use: Yes    Comment: Rare   Drug use: No   Sexual activity: Never    Birth control/protection: None  Other Topics Concern   Not on file  Social History Narrative   Not on file   Social Drivers of Health   Financial Resource Strain: Not on file  Food Insecurity: Food Insecurity Present (11/17/2023)   Hunger Vital Sign    Worried About Running Out of Food in the  Last Year: Sometimes true    Ran Out of Food in the Last Year: Sometimes true  Transportation Needs: No Transportation Needs (11/17/2023)   PRAPARE - Administrator, Civil Service (Medical): No    Lack of Transportation (Non-Medical): No  Physical Activity: Not on file  Stress: Not on file  Social Connections: Not on file  Intimate Partner Violence: Not At Risk (11/17/2023)   Humiliation, Afraid, Rape, and Kick questionnaire    Fear of Current or Ex-Partner: No    Emotionally Abused: No    Physically Abused: No    Sexually Abused: No    FAMILY HISTORY: Family History  Problem Relation Age of Onset   Heart disease Father    Asthma Father    Emphysema Father    Diabetes type II Father        USES INSULIN   Colon cancer Father    Depression Father    Depression Sister    Asthma Sister    Seizures Brother        AS A CHILD   Breast cancer Maternal Aunt    Diabetes type II Paternal Aunt    Liver cancer Paternal Uncle    Dementia Maternal Grandmother    Parkinsonism Paternal Grandmother    Diabetes type II Paternal Grandfather    Asthma Son        WITH A COLD ONLY    ALLERGIES:  is allergic to morphine and erythromycin base.  MEDICATIONS:  Current Outpatient Medications  Medication Sig Dispense Refill   amphetamine-dextroamphetamine (ADDERALL) 30 MG tablet Take 30 mg by mouth 2 (two) times daily.     buPROPion (WELLBUTRIN XL) 300 MG 24 hr tablet Take 300 mg by mouth daily.     MAGNESIUM OXIDE, ELEMENTAL, PO Take by mouth.     melatonin 3 MG TABS tablet Take 6 mg by mouth.     naproxen sodium (ALEVE) 220 MG tablet Take 220 mg by mouth as needed.     WEGOVY 1.7 MG/0.75ML SOAJ SQ injection Inject 1.7 mg into the skin once a week.     No current facility-administered medications for this visit.    REVIEW OF SYSTEMS:   Constitutional: Denies fevers, chills or abnormal night sweats Eyes: Denies blurriness of vision, double vision or watery eyes Ears, nose,  mouth, throat, and face: Denies mucositis or sore throat Respiratory: Denies cough, dyspnea or wheezes Cardiovascular: Denies palpitation, chest discomfort or lower extremity swelling Gastrointestinal:  Denies nausea, heartburn or change in bowel habits Skin: Denies abnormal skin rashes Lymphatics: Denies new lymphadenopathy or easy bruising Neurological:Denies numbness, tingling or new weaknesses Behavioral/Psych: Mood is stable, no new changes  All other systems were reviewed with the patient and are negative.  PHYSICAL EXAMINATION: ECOG PERFORMANCE STATUS: 1 - Symptomatic but completely ambulatory  Vitals:   11/17/23 1019  BP: (!) 141/73  Pulse: 73  Resp: 14  Temp: 97.9 F (36.6 C)  SpO2: 100%   Filed Weights   11/17/23 1019  Weight: 214 lb (97.1 kg)    GENERAL:alert, no distress and comfortable SKIN: skin color, texture, turgor are normal, no rashes or significant lesions EYES: normal, conjunctiva are pink and non-injected, sclera clear OROPHARYNX:no exudate, no erythema and lips, buccal mucosa, and tongue normal  NECK: supple, thyroid normal size, non-tender, without nodularity LYMPH:  no palpable lymphadenopathy in the cervical, axillary or inguinal LUNGS: clear to auscultation and percussion with normal breathing effort HEART: regular rate & rhythm and no murmurs and no lower extremity edema ABDOMEN:abdomen soft, non-tender and normal bowel sounds Musculoskeletal:no cyanosis of digits and no clubbing  PSYCH: alert & oriented x 3 with fluent speech NEURO: no focal motor/sensory deficits  RADIOGRAPHIC STUDIES: I have personally reviewed the radiological images as listed and agreed with the findings in the report. No results found.

## 2023-12-17 NOTE — Telephone Encounter (Signed)
 Patient has been scheduled for follow-up visit per 12/17/23 LOS.  Pt aware of scheduled appt details.

## 2023-12-24 ENCOUNTER — Encounter: Payer: Self-pay | Admitting: Hematology and Oncology

## 2024-01-11 ENCOUNTER — Ambulatory Visit (HOSPITAL_BASED_OUTPATIENT_CLINIC_OR_DEPARTMENT_OTHER)
Admission: EM | Admit: 2024-01-11 | Discharge: 2024-01-11 | Disposition: A | Discharge status: Home / Self Care | Attending: Family Medicine | Admitting: Family Medicine

## 2024-01-11 ENCOUNTER — Encounter (HOSPITAL_BASED_OUTPATIENT_CLINIC_OR_DEPARTMENT_OTHER): Payer: Self-pay

## 2024-01-11 DIAGNOSIS — H66002 Acute suppurative otitis media without spontaneous rupture of ear drum, left ear: Secondary | ICD-10-CM | POA: Diagnosis not present

## 2024-01-11 MED ORDER — AMOXICILLIN 875 MG PO TABS: 875.0000 mg | ORAL_TABLET | Freq: Two times a day (BID) | ORAL | 0 refills | Status: AC

## 2024-01-11 MED ORDER — PREDNISONE 20 MG PO TABS: 40.0000 mg | ORAL_TABLET | Freq: Every day | ORAL | 0 refills | Status: AC

## 2024-01-11 NOTE — Discharge Instructions (Signed)
 Treating you for an ear infection and ear tube inflammation.  Medications as prescribed.  Follow-up as needed

## 2024-01-11 NOTE — ED Provider Notes (Signed)
 [facility]    CSN: [id number] Arrival date & time: 01/11/24  9046      History   Chief Complaint Chief Complaint  Patient presents with   Otalgia    HPI Kristy Castro is a 43 y.o. female.   Pt is a 43 year old female that c/o of left ear pain  x 3 days. Denies ear drainage, fever,sore throat. Pt has taken taken tylenol  for symptoms. Had a cold prior to al of this starting.     Otalgia   Past Medical History:  Diagnosis Date   ADHD    Anemia 02/11/2008   AFTER DELIVERY OF FIRST CHILD   Cellulitis    Chronic bilateral thoracic back pain    Depression    Eczema    Extensor tendonitis of foot    GAD (generalized anxiety disorder)    Headache(784.0)    OCCASIONALLY WITH PREGNANCY   Hypothyroidism    ON SYNTHROID 50 MCG   Lesion of both lower eyelids    Myalgia    Panic disorder    S/P laparoscopic sleeve gastrectomy    S/P panniculectomy    Subcutaneous nodule of lower extremity    Thyroid  disease 12/11/2009   HYPOTHYROID   Uncontrolled pain    Ventral hernia     Patient Active Problem List   Diagnosis Date Noted   Iron deficiency anemia due to chronic blood loss 11/17/2023    Past Surgical History:  Procedure Laterality Date   BREAST REDUCTION SURGERY     DILATION AND CURETTAGE OF UTERUS     HERNIA REPAIR     HERNIA REPAIR  10/2018   INCISION AND DRAINAGE     INCISIONAL HERNIA REPAIR     PANNICULECTOMY     REVISION OF ABDOMINAL SCAR     SALPINGECTOMY     SLEEVE GASTROPLASTY     TUBAL LIGATION     WISDOM TOOTH EXTRACTION  11/2010, 2004   2 REMOVED LT UPPER AND LOWER    OB History     Gravida  5   Para  3   Term  3   Preterm      AB  1   Living  1      SAB      IAB      Ectopic      Multiple      Live Births  3            Home Medications    Prior to Admission medications   Medication Sig Start Date End Date Taking? Authorizing Provider  amoxicillin (AMOXIL) 875 MG tablet Take 1 tablet (875 mg  total) by mouth 2 (two) times daily for 7 days. 01/11/24 01/18/24 Yes Edmund Holcomb A, FNP  amphetamine-dextroamphetamine (ADDERALL) 30 MG tablet Take 30 mg by mouth 2 (two) times daily. 08/31/23  Yes [provider]  buPROPion (WELLBUTRIN XL) 300 MG 24 hr tablet Take 300 mg by mouth daily. 04/10/22 05/24/24 Yes [provider]  predniSONE (DELTASONE) 20 MG tablet Take 2 tablets (40 mg total) by mouth daily with breakfast for 5 days. 01/11/24 01/16/24 Yes Vikas Wegmann A, FNP  MAGNESIUM OXIDE, ELEMENTAL, PO Take by mouth.    [provider]  melatonin 3 MG TABS tablet Take 6 mg by mouth.    [provider]  naproxen sodium (ALEVE) 220 MG tablet Take 220 mg by mouth as needed.    [provider]  WEGOVY 1.7 MG/0.75ML SOAJ SQ  injection Inject 1.7 mg into the skin once a week. 09/11/23   [provider]    Family History Family History  Problem Relation Age of Onset   Heart disease Father    Asthma Father    Emphysema Father    Diabetes type II Father        USES INSULIN   Colon cancer Father    Depression Father    Depression Sister    Asthma Sister    Seizures Brother        AS A CHILD   Breast cancer Maternal Aunt    Diabetes type II Paternal Aunt    Liver cancer Paternal Uncle    Dementia Maternal Grandmother    Parkinsonism Paternal Grandmother    Diabetes type II Paternal Grandfather    Asthma Son        WITH A COLD ONLY    Social History Social History   Tobacco Use   Smoking status: Former    Current packs/day: 0.25    Average packs/day: 0.3 packs/day for 10.0 years (2.5 ttl pk-yrs)    Types: Cigarettes   Smokeless tobacco: Former    Quit date: 04/18/2011  Vaping Use   Vaping status: Never Used  Substance Use Topics   Alcohol use: Yes    Comment: Rare   Drug use: No     Allergies   Morphine and Erythromycin base   Review of Systems Review of Systems  HENT:  Positive for ear pain.      Physical Exam Triage  Vital Signs ED Triage Vitals  Encounter Vitals Group     BP 01/11/24 1105 117/76     Girls Systolic BP Percentile --      Girls Diastolic BP Percentile --      Boys Systolic BP Percentile --      Boys Diastolic BP Percentile --      Pulse Rate 01/11/24 1105 86     Resp 01/11/24 1105 18     Temp 01/11/24 1105 98.4 F (36.9 C)     Temp Source 01/11/24 1105 Oral     SpO2 01/11/24 1105 99 %     Weight --      Height --      Head Circumference --      Peak Flow --      Pain Score 01/11/24 1103 8     Pain Loc --      Pain Education --      Exclude from Growth Chart --    No data found.  Updated Vital Signs BP 117/76 (BP Location: Right Arm)   Pulse 86   Temp 98.4 F (36.9 C) (Oral)   Resp 18   LMP 12/24/2023 (Exact Date)   SpO2 99%   Visual Acuity Right Eye Distance:   Left Eye Distance:   Bilateral Distance:    Right Eye Near:   Left Eye Near:    Bilateral Near:     Physical Exam Vitals and nursing note reviewed.  Constitutional:      General: She is not in acute distress.    Appearance: Normal appearance. She is not ill-appearing, toxic-appearing or diaphoretic.  HENT:     Right Ear: Tympanic membrane, ear canal and external ear normal.     Left Ear: Tympanic membrane is erythematous and bulging.  Pulmonary:     Effort: Pulmonary effort is normal.  Neurological:     Mental Status: She is alert.  Psychiatric:  Mood and Affect: Mood normal.      UC Treatments / Results  Labs (all labs ordered are listed, but only abnormal results are displayed) Labs Reviewed - No data to display  EKG   Radiology No results found.  Procedures Procedures (including critical care time)  Medications Ordered in UC Medications - No data to display  Initial Impression / Assessment and Plan / UC Course  I have reviewed the triage vital signs and the nursing notes.  Pertinent labs & imaging results that were available during my care of the patient were reviewed  by me and considered in my medical decision making (see chart for details).     Acute otitis media of the left ear with eustachian tube dysfunction.  Treating with amoxicillin and prednisone.  Medications as prescribed.  Follow-up as needed Final Clinical Impressions(s) / UC Diagnoses   Final diagnoses:  Non-recurrent acute suppurative otitis media of left ear without spontaneous rupture of tympanic membrane     Discharge Instructions      Treating you for an ear infection and ear tube inflammation.  Medications as prescribed.  Follow-up as needed   ED Prescriptions     Medication Sig Dispense Auth. Provider   amoxicillin (AMOXIL) 875 MG tablet Take 1 tablet (875 mg total) by mouth 2 (two) times daily for 7 days. 14 tablet Eoin Willden A, FNP   predniSONE (DELTASONE) 20 MG tablet Take 2 tablets (40 mg total) by mouth daily with breakfast for 5 days. 10 tablet Adah Wilbert LABOR, FNP      PDMP not reviewed this encounter.   Adah Wilbert LABOR, FNP 01/11/24 [phone]

## 2024-01-11 NOTE — ED Triage Notes (Addendum)
 Pt c/o of left ear pain pain is radiating inside/outside the ear x 3 days. Denies ear drainage, fever,sore throat. Pt has taken taken tylenol  for symptoms.

## 2024-03-17 ENCOUNTER — Telehealth: Payer: Self-pay | Admitting: Hematology and Oncology

## 2024-03-17 NOTE — Telephone Encounter (Signed)
 Rescheduled appointments pre room/resource. Eleanor Mouse NP will not be in the office on Friday 03/18/24. Talked with the patient and she is aware of the changes made to her upcoming appointments.

## 2024-03-18 ENCOUNTER — Inpatient Hospital Stay

## 2024-03-18 ENCOUNTER — Inpatient Hospital Stay: Admitting: Hematology and Oncology

## 2024-03-21 ENCOUNTER — Inpatient Hospital Stay: Attending: Hematology and Oncology

## 2024-03-21 ENCOUNTER — Inpatient Hospital Stay: Admitting: Hematology and Oncology
